# Patient Record
Sex: Male | Born: 1975
Health system: Southern US, Community
[De-identification: ages and names within clinical notes are randomized; demographics above are authoritative.]

## PROBLEM LIST (undated history)

## (undated) DIAGNOSIS — E785 Hyperlipidemia, unspecified: Secondary | ICD-10-CM

## (undated) DIAGNOSIS — A6920 Lyme disease, unspecified: Secondary | ICD-10-CM

## (undated) HISTORY — DX: Hyperlipidemia, unspecified: E78.5

## (undated) HISTORY — DX: Lyme disease, unspecified: A69.20

## (undated) HISTORY — PX: VASECTOMY: SHX75

---

## 2002-09-23 DIAGNOSIS — A6920 Lyme disease, unspecified: Secondary | ICD-10-CM

## 2002-09-23 HISTORY — DX: Lyme disease, unspecified: A69.20

## 2013-07-30 ENCOUNTER — Ambulatory Visit (INDEPENDENT_AMBULATORY_CARE_PROVIDER_SITE_OTHER): Payer: BC Managed Care – PPO | Admitting: Family Medicine

## 2013-07-30 ENCOUNTER — Encounter: Payer: Self-pay | Admitting: Family Medicine

## 2013-07-30 VITALS — BP 133/82 | HR 71 | Resp 16 | Ht 68.0 in | Wt 192.0 lb

## 2013-07-30 DIAGNOSIS — Z23 Encounter for immunization: Secondary | ICD-10-CM

## 2013-07-30 DIAGNOSIS — M722 Plantar fascial fibromatosis: Secondary | ICD-10-CM

## 2013-07-30 MED ORDER — TRAMADOL HCL 50 MG PO TABS: 50.0000 mg | ORAL_TABLET | Freq: Two times a day (BID) | ORAL | Status: DC | PRN

## 2013-07-30 MED ORDER — NABUMETONE 750 MG PO TABS: 750.0000 mg | ORAL_TABLET | Freq: Two times a day (BID) | ORAL | Status: DC | PRN

## 2013-07-30 NOTE — Patient Instructions (Signed)
1)  Foot Pain - It sounds like you probably have Plantar Fasciitis +/- Bone Spur.  Try taking the Relafen 750, Ultram 50 & Tylenol 1000 mg twice a day for pain to calm down the pain.     Plantar Fasciitis (Heel Spur Syndrome) with Rehab The plantar fascia is a fibrous, ligament-like, soft-tissue structure that spans the bottom of the foot. Plantar fasciitis is a condition that causes pain in the foot due to inflammation of the tissue. SYMPTOMS   Pain and tenderness on the underneath side of the foot.  Pain that worsens with standing or walking. CAUSES  Plantar fasciitis is caused by irritation and injury to the plantar fascia on the underneath side of the foot. Common mechanisms of injury include:  Direct trauma to bottom of the foot.  Damage to a small nerve that runs under the foot where the main fascia attaches to the heel bone.  Stress placed on the plantar fascia due to bone spurs. RISK INCREASES WITH:   Activities that place stress on the plantar fascia (running, jumping, pivoting, or cutting).  Poor strength and flexibility.  Improperly fitted shoes.  Tight calf muscles.  Flat feet.  Failure to warm-up properly before activity.  Obesity. PREVENTION  Warm up and stretch properly before activity.  Allow for adequate recovery between workouts.  Maintain physical fitness:  Strength, flexibility, and endurance.  Cardiovascular fitness.  Maintain a health body weight.  Avoid stress on the plantar fascia.  Wear properly fitted shoes, including arch supports for individuals who have flat feet. PROGNOSIS  If treated properly, then the symptoms of plantar fasciitis usually resolve without surgery. However, occasionally surgery is necessary. RELATED COMPLICATIONS   Recurrent symptoms that may result in a chronic condition.  Problems of the lower back that are caused by compensating for the injury, such as limping.  Pain or weakness of the foot during push-off  following surgery.  Chronic inflammation, scarring, and partial or complete fascia tear, occurring more often from repeated injections. TREATMENT  Treatment initially involves the use of ice and medication to help reduce pain and inflammation. The use of strengthening and stretching exercises may help reduce pain with activity, especially stretches of the Achilles tendon. These exercises may be performed at home or with a therapist. Your caregiver may recommend that you use heel cups of arch supports to help reduce stress on the plantar fascia. Occasionally, corticosteroid injections are given to reduce inflammation. If symptoms persist for greater than 6 months despite non-surgical (conservative), then surgery may be recommended.  MEDICATION   If pain medication is necessary, then nonsteroidal anti-inflammatory medications, such as aspirin and ibuprofen, or other minor pain relievers, such as acetaminophen, are often recommended.  Do not take pain medication within 7 days before surgery.  Prescription pain relievers may be given if deemed necessary by your caregiver. Use only as directed and only as much as you need.  Corticosteroid injections may be given by your caregiver. These injections should be reserved for the most serious cases, because they may only be given a certain number of times. HEAT AND COLD  Cold treatment (icing) relieves pain and reduces inflammation. Cold treatment should be applied for 10 to 15 minutes every 2 to 3 hours for inflammation and pain and immediately after any activity that aggravates your symptoms. Use ice packs or massage the area with a piece of ice (ice massage).  Heat treatment may be used prior to performing the stretching and strengthening activities prescribed by your  caregiver, physical therapist, or athletic trainer. Use a heat pack or soak the injury in warm water. SEEK IMMEDIATE MEDICAL CARE IF:  Treatment seems to offer no benefit, or the condition  worsens.  Any medications produce adverse side effects. EXERCISES RANGE OF MOTION (ROM) AND STRETCHING EXERCISES - Plantar Fasciitis (Heel Spur Syndrome) These exercises may help you when beginning to rehabilitate your injury. Your symptoms may resolve with or without further involvement from your physician, physical therapist or athletic trainer. While completing these exercises, remember:   Restoring tissue flexibility helps normal motion to return to the joints. This allows healthier, less painful movement and activity.  An effective stretch should be held for at least 30 seconds.  A stretch should never be painful. You should only feel a gentle lengthening or release in the stretched tissue. RANGE OF MOTION - Toe Extension, Flexion  Sit with your right / left leg crossed over your opposite knee.  Grasp your toes and gently pull them back toward the top of your foot. You should feel a stretch on the bottom of your toes and/or foot.  Hold this stretch for __________ seconds.  Now, gently pull your toes toward the bottom of your foot. You should feel a stretch on the top of your toes and or foot.  Hold this stretch for __________ seconds. Repeat __________ times. Complete this stretch __________ times per day.  RANGE OF MOTION - Ankle Dorsiflexion, Active Assisted  Remove shoes and sit on a chair that is preferably not on a carpeted surface.  Place right / left foot under knee. Extend your opposite leg for support.  Keeping your heel down, slide your right / left foot back toward the chair until you feel a stretch at your ankle or calf. If you do not feel a stretch, slide your bottom forward to the edge of the chair, while still keeping your heel down.  Hold this stretch for __________ seconds. Repeat __________ times. Complete this stretch __________ times per day.  STRETCH  Gastroc, Standing  Place hands on wall.  Extend right / left leg, keeping the front knee somewhat  bent.  Slightly point your toes inward on your back foot.  Keeping your right / left heel on the floor and your knee straight, shift your weight toward the wall, not allowing your back to arch.  You should feel a gentle stretch in the right / left calf. Hold this position for __________ seconds. Repeat __________ times. Complete this stretch __________ times per day. STRETCH  Soleus, Standing  Place hands on wall.  Extend right / left leg, keeping the other knee somewhat bent.  Slightly point your toes inward on your back foot.  Keep your right / left heel on the floor, bend your back knee, and slightly shift your weight over the back leg so that you feel a gentle stretch deep in your back calf.  Hold this position for __________ seconds. Repeat __________ times. Complete this stretch __________ times per day. STRETCH  Gastrocsoleus, Standing  Note: This exercise can place a lot of stress on your foot and ankle. Please complete this exercise only if specifically instructed by your caregiver.   Place the ball of your right / left foot on a step, keeping your other foot firmly on the same step.  Hold on to the wall or a rail for balance.  Slowly lift your other foot, allowing your body weight to press your heel down over the edge of the step.  You should feel a stretch in your right / left calf.  Hold this position for __________ seconds.  Repeat this exercise with a slight bend in your right / left knee. Repeat __________ times. Complete this stretch __________ times per day.  STRENGTHENING EXERCISES - Plantar Fasciitis (Heel Spur Syndrome)  These exercises may help you when beginning to rehabilitate your injury. They may resolve your symptoms with or without further involvement from your physician, physical therapist or athletic trainer. While completing these exercises, remember:   Muscles can gain both the endurance and the strength needed for everyday activities through  controlled exercises.  Complete these exercises as instructed by your physician, physical therapist or athletic trainer. Progress the resistance and repetitions only as guided. STRENGTH - Towel Curls  Sit in a chair positioned on a non-carpeted surface.  Place your foot on a towel, keeping your heel on the floor.  Pull the towel toward your heel by only curling your toes. Keep your heel on the floor.  If instructed by your physician, physical therapist or athletic trainer, add ____________________ at the end of the towel. Repeat __________ times. Complete this exercise __________ times per day. STRENGTH - Ankle Inversion  Secure one end of a rubber exercise band/tubing to a fixed object (table, pole). Loop the other end around your foot just before your toes.  Place your fists between your knees. This will focus your strengthening at your ankle.  Slowly, pull your big toe up and in, making sure the band/tubing is positioned to resist the entire motion.  Hold this position for __________ seconds.  Have your muscles resist the band/tubing as it slowly pulls your foot back to the starting position. Repeat __________ times. Complete this exercises __________ times per day.  Document Released: 09/09/2005 Document Revised: 12/02/2011 Document Reviewed: 12/22/2008 [facility] Patient Information 2014 [place], [place].  Plantar Fasciitis Plantar fasciitis is a common condition that causes foot pain. It is soreness (inflammation) of the band of tough fibrous tissue on the bottom of the foot that runs from the heel bone (calcaneus) to the ball of the foot. The cause of this soreness may be from excessive standing, poor fitting shoes, running on hard surfaces, being overweight, having an abnormal walk, or overuse (this is common in runners) of the painful foot or feet. It is also common in aerobic exercise dancers and ballet dancers. SYMPTOMS  Most people with plantar fasciitis complain of:  Severe  pain in the morning on the bottom of their foot especially when taking the first steps out of bed. This pain recedes after a few minutes of walking.  Severe pain is experienced also during walking following a long period of inactivity.  Pain is worse when walking barefoot or up stairs DIAGNOSIS   Your caregiver will diagnose this condition by examining and feeling your foot.  Special tests such as X-rays of your foot, are usually not needed. PREVENTION   Consult a sports medicine professional before beginning a new exercise program.  Walking programs offer a good workout. With walking there is a lower chance of overuse injuries common to runners. There is less impact and less jarring of the joints.  Begin all new exercise programs slowly. If problems or pain develop, decrease the amount of time or distance until you are at a comfortable level.  Wear good shoes and replace them regularly.  Stretch your foot and the heel cords at the back of the ankle (Achilles tendon) both before and after exercise.  Run  or exercise on even surfaces that are not hard. For example, asphalt is better than pavement.  Do not run barefoot on hard surfaces.  If using a treadmill, vary the incline.  Do not continue to workout if you have foot or joint problems. Seek professional help if they do not improve. HOME CARE INSTRUCTIONS   Avoid activities that cause you pain until you recover.  Use ice or cold packs on the problem or painful areas after working out.  Only take over-the-counter or prescription medicines for pain, discomfort, or fever as directed by your caregiver.  Soft shoe inserts or athletic shoes with air or gel sole cushions may be helpful.  If problems continue or become more severe, consult a sports medicine caregiver or your own health care provider. Cortisone is a potent anti-inflammatory medication that may be injected into the painful area. You can discuss this treatment with your  caregiver. MAKE SURE YOU:   Understand these instructions.  Will watch your condition.  Will get help right away if you are not doing well or get worse. Document Released: 06/04/2001 Document Revised: 12/02/2011 Document Reviewed: 08/03/2008 [facility] Patient Information 2014 [place], [place].

## 2013-07-30 NOTE — Progress Notes (Signed)
  Subjective:    Patient ID: Dennis Leach, male    DOB: 1975/10/11, 37 y.o.   MRN: [id number]  HPI  Dennis Leach is here today to establish care with our practice.  Dennis Leach found our name online.  Dennis Leach says that Dennis Leach has not had a PCP for several years.  Dennis Leach and his family recently moved to [place] from [place].  Overall, Dennis Leach feels that Dennis Leach is in good health.  His biggest complaint today is pain in his feet.  Dennis Leach has been having this pain for the past year.  Dennis Leach describes the pain as a burning sensation that is worse in his heal.  Dennis Leach has bought special shoes for work and Dennis Leach has also taken OTC analgesics which have not helped him much.      Review of Systems  Constitutional: Negative.   HENT: Negative.   Eyes: Negative.   Respiratory: Negative.   Cardiovascular: Negative.   Gastrointestinal: Negative.   Endocrine: Negative.   Genitourinary: Negative.   Musculoskeletal:       Bilateral feet pain  Skin: Negative.   Allergic/Immunologic: Negative.   Neurological: Negative.   Hematological: Negative.   Psychiatric/Behavioral: Negative.      Past Medical History  Diagnosis Date  . Lyme disease 2004  . Hyperlipidemia      History   Social History Narrative   Marital Status: Married [employer] manager)    Children:  Daughter Duwayne Heck) Son Reuel Boom)    Pets: None    Living Situation: Lives with wife and children    Occupation:  Emergency planning/management officer - Corporate treasurer)    Education: Engineer, agricultural    Tobacco Use/Exposure:  None    Alcohol Use:  None    Drug Use:  None   Diet:  Regular   Exercise:  Limited    Hobbies:  Play Soccer                 Family History  Problem Relation Age of Onset  . Thrombosis Father      No Known Allergies   Immunization History  Administered Date(s) Administered  . Influenza,inj,Quad PF,36+ Mos 07/30/2013       Objective:   Physical Exam  Vitals reviewed. Constitutional: Dennis Leach is oriented to person, place, and time. Dennis Leach appears well-developed and  well-nourished.  Cardiovascular: Normal rate and regular rhythm.   Pulmonary/Chest: Effort normal and breath sounds normal.  Musculoskeletal: Dennis Leach exhibits tenderness (Pain in heels ). Dennis Leach exhibits no edema.  Neurological: Dennis Leach is alert and oriented to person, place, and time.  Skin: Skin is warm and dry.  Psychiatric: Dennis Leach has a normal mood and affect.      Assessment & Plan:    Dennis Leach was seen today for pain in feet.  Diagnoses and associated orders for this visit:  Plantar fasciitis, bilateral - nabumetone (RELAFEN) 750 MG tablet; Take 1 tablet (750 mg total) by mouth 2 (two) times daily as needed for moderate pain. - traMADol (ULTRAM) 50 MG tablet; Take 1 tablet (50 mg total) by mouth every 12 (twelve) hours as needed for moderate pain.  Need for prophylactic vaccination and inoculation against influenza - Flu Vaccine QUAD 36+ mos PF IM (Fluarix)   TIME SPENT "FACE TO FACE" WITH PATIENT -  30  MINS

## 2013-09-28 DIAGNOSIS — M722 Plantar fascial fibromatosis: Secondary | ICD-10-CM | POA: Insufficient documentation

## 2013-09-28 DIAGNOSIS — Z23 Encounter for immunization: Secondary | ICD-10-CM | POA: Insufficient documentation

## 2013-09-28 NOTE — Assessment & Plan Note (Signed)
The patient confirmed that they are not allergic to eggs and have never had a bad reaction with the flu shot in the past.  The vaccination was given without difficulty.   

## 2013-10-23 DIAGNOSIS — M722 Plantar fascial fibromatosis: Secondary | ICD-10-CM | POA: Insufficient documentation

## 2014-01-31 ENCOUNTER — Other Ambulatory Visit: Payer: Self-pay | Admitting: *Deleted

## 2014-01-31 DIAGNOSIS — Z0000 Encounter for general adult medical examination without abnormal findings: Secondary | ICD-10-CM

## 2014-02-01 ENCOUNTER — Other Ambulatory Visit: Payer: BC Managed Care – PPO

## 2014-02-01 LAB — CBC WITH DIFFERENTIAL/PLATELET
Basophils Absolute: 0.1 10*3/uL (ref 0.0–0.1)
Basophils Relative: 1 % (ref 0–1)
Eosinophils Absolute: 0.3 10*3/uL (ref 0.0–0.7)
Eosinophils Relative: 5 % (ref 0–5)
HCT: 42.1 % (ref 39.0–52.0)
Hemoglobin: 14.7 g/dL (ref 13.0–17.0)
Lymphocytes Relative: 54 % — ABNORMAL HIGH (ref 12–46)
Lymphs Abs: 3.3 10*3/uL (ref 0.7–4.0)
MCH: 30.6 pg (ref 26.0–34.0)
MCHC: 34.9 g/dL (ref 30.0–36.0)
MCV: 87.5 fL (ref 78.0–100.0)
Monocytes Absolute: 0.5 10*3/uL (ref 0.1–1.0)
Monocytes Relative: 8 % (ref 3–12)
Neutro Abs: 2 10*3/uL (ref 1.7–7.7)
Neutrophils Relative %: 32 % — ABNORMAL LOW (ref 43–77)
Platelets: 369 10*3/uL (ref 150–400)
RBC: 4.81 MIL/uL (ref 4.22–5.81)
RDW: 13.7 % (ref 11.5–15.5)
WBC: 6.1 10*3/uL (ref 4.0–10.5)

## 2014-02-01 LAB — COMPLETE METABOLIC PANEL WITH GFR
ALT: 16 U/L (ref 0–53)
AST: 18 U/L (ref 0–37)
Albumin: 4.5 g/dL (ref 3.5–5.2)
Alkaline Phosphatase: 58 U/L (ref 39–117)
BUN: 20 mg/dL (ref 6–23)
CO2: 22 mEq/L (ref 19–32)
Calcium: 9.2 mg/dL (ref 8.4–10.5)
Chloride: 105 mEq/L (ref 96–112)
Creat: 1.08 mg/dL (ref 0.50–1.35)
GFR, Est African American: >89 mL/min
GFR, Est Non African American: 85 mL/min
Glucose, Bld: 102 mg/dL — ABNORMAL HIGH (ref 70–99)
Potassium: 4.3 mEq/L (ref 3.5–5.3)
Sodium: 140 mEq/L (ref 135–145)
Total Bilirubin: 0.9 mg/dL (ref 0.2–1.2)
Total Protein: 6.7 g/dL (ref 6.0–8.3)

## 2014-02-01 LAB — LIPID PANEL
Cholesterol: 166 mg/dL (ref 0–200)
HDL: 47 mg/dL (ref >39)
LDL Cholesterol: 97 mg/dL (ref 0–99)
Total CHOL/HDL Ratio: 3.5 Ratio
Triglycerides: 109 mg/dL (ref <150)
VLDL: 22 mg/dL (ref 0–40)

## 2014-02-01 LAB — TSH: TSH: 2.019 u[IU]/mL (ref 0.350–4.500)

## 2014-02-02 LAB — VITAMIN D 25 HYDROXY (VIT D DEFICIENCY, FRACTURES): Vit D, 25-Hydroxy: 22 ng/mL — ABNORMAL LOW (ref 30–89)

## 2014-02-08 ENCOUNTER — Encounter: Payer: Self-pay | Admitting: Family Medicine

## 2014-02-08 ENCOUNTER — Ambulatory Visit (INDEPENDENT_AMBULATORY_CARE_PROVIDER_SITE_OTHER): Payer: BC Managed Care – PPO | Admitting: Family Medicine

## 2014-02-08 VITALS — BP 114/76 | HR 78 | Resp 16 | Ht 68.0 in | Wt 193.0 lb

## 2014-02-08 DIAGNOSIS — K219 Gastro-esophageal reflux disease without esophagitis: Secondary | ICD-10-CM

## 2014-02-08 DIAGNOSIS — Z0000 Encounter for general adult medical examination without abnormal findings: Secondary | ICD-10-CM

## 2014-02-08 DIAGNOSIS — R05 Cough: Secondary | ICD-10-CM

## 2014-02-08 DIAGNOSIS — Z23 Encounter for immunization: Secondary | ICD-10-CM

## 2014-02-08 DIAGNOSIS — R059 Cough, unspecified: Secondary | ICD-10-CM

## 2014-02-08 LAB — POCT URINALYSIS DIPSTICK
Bilirubin, UA: NEGATIVE
Blood, UA: NEGATIVE
Glucose, UA: NEGATIVE
Ketones, UA: NEGATIVE
Leukocytes, UA: NEGATIVE
Nitrite, UA: NEGATIVE
Protein, UA: NEGATIVE
Spec Grav, UA: 1.02
Urobilinogen, UA: NEGATIVE
pH, UA: 6.5

## 2014-02-08 MED ORDER — PANTOPRAZOLE SODIUM 40 MG PO TBEC: 40.0000 mg | DELAYED_RELEASE_TABLET | Freq: Every day | ORAL | Status: DC

## 2014-02-08 NOTE — Patient Instructions (Addendum)
1)  Cough - Consider post nasal drip vs GERD - You can try some Zyrtec for the post nasal drip and Protonix for the acid reflux.    2)  Preventative - Take the Vitamiin D3 2,000 IU and complete stool cards (Guaiac Test) and return to our office.  You received a TDAP at today's visit.     Cough, Adult  A cough is a reflex that helps clear your throat and airways. It can help heal the body or may be a reaction to an irritated airway. A cough may only last 2 or 3 weeks (acute) or may last more than 8 weeks (chronic).  CAUSES Acute cough:  Viral or bacterial infections. Chronic cough:  Infections.  Allergies.  Asthma.  Post-nasal drip.  Smoking.  Heartburn or acid reflux.  Some medicines.  Chronic lung problems (COPD).  Cancer. SYMPTOMS   Cough.  Fever.  Chest pain.  Increased breathing rate.  High-pitched whistling sound when breathing (wheezing).  Colored mucus that you cough up (sputum). TREATMENT   A bacterial cough may be treated with antibiotic medicine.  A viral cough must run its course and will not respond to antibiotics.  Your caregiver may recommend other treatments if you have a chronic cough. HOME CARE INSTRUCTIONS   Only take over-the-counter or prescription medicines for pain, discomfort, or fever as directed by your caregiver. Use cough suppressants only as directed by your caregiver.  Use a cold steam vaporizer or humidifier in your bedroom or home to help loosen secretions.  Sleep in a semi-upright position if your cough is worse at night.  Rest as needed.  Stop smoking if you smoke. SEEK IMMEDIATE MEDICAL CARE IF:   You have pus in your sputum.  Your cough starts to worsen.  You cannot control your cough with suppressants and are losing sleep.  You begin coughing up blood.  You have difficulty breathing.  You develop pain which is getting worse or is uncontrolled with medicine.  You have a fever. MAKE SURE YOU:   Understand  these instructions.  Will watch your condition.  Will get help right away if you are not doing well or get worse. Document Released: 03/08/2011 Document Revised: 12/02/2011 Document Reviewed: 03/08/2011 [facility] Patient Information 2014 [place], [place].  Guaiac Test A guaiac test checks for blood in the poop (bowel movement). BEFORE THE TEST  Avoid alcohol and iron pills.  Only take medicine as told by your doctor.  Ask your doctor about stopping or changing medicines.  Avoid Vitamin C pills or medicines.  Avoid antiseptic solution that has iodine in it.  Avoid citrus fruits and juices. TEST  Put the sample cards and sticks in the bathroom so they will be ready when you have to poop.  Collect a poop sample with the stick.  Smear a small amount of poop on the card.  Take another sample from a different part of the poop.  Store the cards with the samples in a plastic bag.  Throw the rest of the poop into the toilet and flush.  Wash your hands well. AFTER THE TEST  Bring the cards in a plastic bag to your clinic.  Do not wait more than 3 days to take the card to your clinic. Document Released: 06/18/2008 Document Revised: 12/02/2011 Document Reviewed: 06/18/2008 [facility] Patient Information 2014 [place], [place].

## 2014-02-08 NOTE — Progress Notes (Signed)
   Subjective:    Patient ID: Dennis Leach, male    DOB: 06/10/1976, 38 y.o.   MRN: [id number]  HPI  Dennis Leach is here today for his annual CPE. We are going over his lab results.  Overall, he feels that he is in good health.  His only complaint is a cough that he has had for the past 2 months.  He says that it is worse at night. He has taken OTC cough meds which have not helped.    Review of Systems  Constitutional: Negative for activity change, appetite change and fatigue.  Respiratory: Positive for cough.   Cardiovascular: Negative for chest pain, palpitations and leg swelling.  Gastrointestinal: Negative for constipation.  Psychiatric/Behavioral: Negative for behavioral problems and sleep disturbance. The patient is not nervous/anxious.   All other systems reviewed and are negative.    Past Medical History  Diagnosis Date  . Lyme disease 2004  . 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 Thrombosis Father   . Diabetes Father      No Known Allergies   Immunization History  Administered Date(s) Administered  . Influenza,inj,Quad PF,36+ Mos 07/30/2013  . Tdap 02/08/2014       Objective:   Physical Exam  Nursing note and vitals reviewed. Constitutional: He is oriented to person, place, and time. He appears well-developed and well-nourished.  HENT:  Head: Normocephalic and atraumatic.  Right Ear: External ear normal.  Left Ear: External ear normal.  Nose: Nose normal.  Mouth/Throat: Oropharynx is clear and  moist.  Eyes: Conjunctivae and EOM are normal. Pupils are equal, round, and reactive to light.  Neck: Normal range of motion. Neck supple.  Cardiovascular: Normal rate, regular rhythm, normal heart sounds and intact distal pulses.   Pulmonary/Chest: Effort normal and breath sounds normal.  Abdominal: Soft. Bowel sounds are normal.  Genitourinary: Penis normal.  Musculoskeletal: Normal range of motion.       Legs: Neurological: He is alert and oriented to person, place, and time. He has normal reflexes.  Skin: Skin is warm and dry.  Psychiatric: He has a normal mood and affect. His behavior is normal. Judgment and thought content normal.      Assessment & Plan:    Dennis Leach was seen today for annual exam.  Diagnoses and associated orders for this visit:  Routine general medical examination at a health care facility - POCT urinalysis dipstick  Cough  GERD (gastroesophageal reflux disease) - pantoprazole (PROTONIX) 40 MG tablet; Take 1 tablet (40 mg total) by mouth daily.  Need for Tdap vaccination - Tdap vaccine greater than or equal to 7yo IM

## 2014-05-05 DIAGNOSIS — R059 Cough, unspecified: Secondary | ICD-10-CM | POA: Insufficient documentation

## 2014-05-05 DIAGNOSIS — K219 Gastro-esophageal reflux disease without esophagitis: Secondary | ICD-10-CM | POA: Insufficient documentation

## 2014-05-05 DIAGNOSIS — R05 Cough: Secondary | ICD-10-CM | POA: Insufficient documentation

## 2020-10-23 DIAGNOSIS — R0683 Snoring: Secondary | ICD-10-CM | POA: Diagnosis not present

## 2020-10-23 DIAGNOSIS — G4719 Other hypersomnia: Secondary | ICD-10-CM | POA: Diagnosis not present

## 2020-10-31 DIAGNOSIS — R0683 Snoring: Secondary | ICD-10-CM | POA: Diagnosis not present

## 2020-10-31 DIAGNOSIS — G4719 Other hypersomnia: Secondary | ICD-10-CM | POA: Diagnosis not present

## 2020-11-08 DIAGNOSIS — G4733 Obstructive sleep apnea (adult) (pediatric): Secondary | ICD-10-CM | POA: Diagnosis not present

## 2020-12-25 DIAGNOSIS — G4733 Obstructive sleep apnea (adult) (pediatric): Secondary | ICD-10-CM | POA: Diagnosis not present

## 2021-01-03 DIAGNOSIS — G4733 Obstructive sleep apnea (adult) (pediatric): Secondary | ICD-10-CM | POA: Diagnosis not present

## 2021-05-02 ENCOUNTER — Other Ambulatory Visit (HOSPITAL_COMMUNITY): Payer: Self-pay | Admitting: Family Medicine

## 2021-05-02 ENCOUNTER — Emergency Department (HOSPITAL_COMMUNITY): Payer: 59

## 2021-05-02 ENCOUNTER — Other Ambulatory Visit: Payer: Self-pay

## 2021-05-02 ENCOUNTER — Ambulatory Visit (HOSPITAL_BASED_OUTPATIENT_CLINIC_OR_DEPARTMENT_OTHER)
Admission: RE | Admit: 2021-05-02 | Discharge: 2021-05-02 | Disposition: A | Discharge status: Home / Self Care | Payer: 59 | Source: Ambulatory Visit | Attending: Family Medicine | Admitting: Family Medicine

## 2021-05-02 ENCOUNTER — Encounter (HOSPITAL_COMMUNITY): Payer: Self-pay | Admitting: *Deleted

## 2021-05-02 ENCOUNTER — Emergency Department (HOSPITAL_COMMUNITY)
Admission: EM | Admit: 2021-05-02 | Discharge: 2021-05-02 | Disposition: A | Discharge status: Home / Self Care | Payer: 59 | Attending: Emergency Medicine | Admitting: Emergency Medicine

## 2021-05-02 DIAGNOSIS — M79605 Pain in left leg: Secondary | ICD-10-CM

## 2021-05-02 DIAGNOSIS — I82492 Acute embolism and thrombosis of other specified deep vein of left lower extremity: Secondary | ICD-10-CM

## 2021-05-02 DIAGNOSIS — M7989 Other specified soft tissue disorders: Secondary | ICD-10-CM

## 2021-05-02 DIAGNOSIS — I2699 Other pulmonary embolism without acute cor pulmonale: Secondary | ICD-10-CM | POA: Diagnosis not present

## 2021-05-02 DIAGNOSIS — I2694 Multiple subsegmental pulmonary emboli without acute cor pulmonale: Secondary | ICD-10-CM

## 2021-05-02 DIAGNOSIS — Z1211 Encounter for screening for malignant neoplasm of colon: Secondary | ICD-10-CM | POA: Diagnosis not present

## 2021-05-02 DIAGNOSIS — R7303 Prediabetes: Secondary | ICD-10-CM | POA: Diagnosis not present

## 2021-05-02 DIAGNOSIS — R9431 Abnormal electrocardiogram [ECG] [EKG]: Secondary | ICD-10-CM | POA: Diagnosis not present

## 2021-05-02 DIAGNOSIS — Z0000 Encounter for general adult medical examination without abnormal findings: Secondary | ICD-10-CM | POA: Diagnosis not present

## 2021-05-02 DIAGNOSIS — I82402 Acute embolism and thrombosis of unspecified deep veins of left lower extremity: Secondary | ICD-10-CM | POA: Diagnosis not present

## 2021-05-02 LAB — CBC WITH DIFFERENTIAL/PLATELET
Abs Immature Granulocytes: 0.08 10*3/uL — ABNORMAL HIGH (ref 0.00–0.07)
Basophils Absolute: 0.1 10*3/uL (ref 0.0–0.1)
Basophils Relative: 1 %
Eosinophils Absolute: 0.7 10*3/uL — ABNORMAL HIGH (ref 0.0–0.5)
Eosinophils Relative: 7 %
HCT: 47.1 % (ref 39.0–52.0)
Hemoglobin: 15.6 g/dL (ref 13.0–17.0)
Immature Granulocytes: 1 %
Lymphocytes Relative: 30 %
Lymphs Abs: 2.7 10*3/uL (ref 0.7–4.0)
MCH: 30.1 pg (ref 26.0–34.0)
MCHC: 33.1 g/dL (ref 30.0–36.0)
MCV: 90.9 fL (ref 80.0–100.0)
Monocytes Absolute: 0.5 10*3/uL (ref 0.1–1.0)
Monocytes Relative: 6 %
Neutro Abs: 5.1 10*3/uL (ref 1.7–7.7)
Neutrophils Relative %: 55 %
Platelets: 331 10*3/uL (ref 150–400)
RBC: 5.18 MIL/uL (ref 4.22–5.81)
RDW: 13.4 % (ref 11.5–15.5)
WBC: 9.1 10*3/uL (ref 4.0–10.5)
nRBC: 0 % (ref 0.0–0.2)

## 2021-05-02 LAB — BASIC METABOLIC PANEL
Anion gap: 7 (ref 5–15)
BUN: 20 mg/dL (ref 6–20)
CO2: 24 mmol/L (ref 22–32)
Calcium: 9.7 mg/dL (ref 8.9–10.3)
Chloride: 106 mmol/L (ref 98–111)
Creatinine, Ser: 1.06 mg/dL (ref 0.61–1.24)
GFR, Estimated: >60 mL/min (ref >60)
Glucose, Bld: 116 mg/dL — ABNORMAL HIGH (ref 70–99)
Potassium: 3.7 mmol/L (ref 3.5–5.1)
Sodium: 137 mmol/L (ref 135–145)

## 2021-05-02 LAB — TROPONIN I (HIGH SENSITIVITY)
Troponin I (High Sensitivity): 3 ng/L (ref <18)
Troponin I (High Sensitivity): 3 ng/L (ref <18)

## 2021-05-02 MED ORDER — APIXABAN (ELIQUIS) EDUCATION KIT FOR DVT/PE PATIENTS
PACK | Freq: Once | Status: AC
Start: 2021-05-02 — End: 2021-05-02
  Filled 2021-05-02: qty 1

## 2021-05-02 MED ORDER — RIVAROXABAN (XARELTO) EDUCATION KIT FOR DVT/PE PATIENTS: PACK | Freq: Once | Status: DC

## 2021-05-02 MED ORDER — RIVAROXABAN 15 MG PO TABS: 15.0000 mg | ORAL_TABLET | Freq: Once | ORAL | Status: DC

## 2021-05-02 MED ORDER — IOHEXOL 350 MG/ML SOLN
100.0000 mL | Freq: Once | INTRAVENOUS | Status: AC | PRN
  Administered 2021-05-02: 65 mL via INTRAVENOUS

## 2021-05-02 MED ORDER — APIXABAN 5 MG PO TABS
10.0000 mg | ORAL_TABLET | Freq: Once | ORAL | Status: AC
Start: 2021-05-02 — End: 2021-05-02
  Administered 2021-05-02: 10 mg via ORAL
  Filled 2021-05-02: qty 2

## 2021-05-02 NOTE — ED Provider Notes (Signed)
Pt seen in conjunction with C Prosperi, PA-C. Please see his notes for full history, exam, and plan.   In brief, patient presenting for evaluation of left leg pain and intermittent chest pain shortness of breath.  Ultrasound ordered by PCP positive for DVT.  As patient reported intermittent chest pain shortness of breath, CTA PE was ordered in triage, found to have small subsegmental PEs on the right side.  However patient able to ambulate without hypoxia, tachycardia, shortness of breath.  States he is feeling well at this time, no symptoms.  Pasi score puts him at very low risk.  Discussed plan for outpatient treatment with a DOAC.  Patient's PCP has already called and Eliquis to patient's pharmacy, as such, first dose given in the ER and starter pack given by pharmacy.  Encouraged close follow-up with PCP.   Alveria Apley, PA-C 05/02/21 2218    Benjiman Core, MD 05/02/21 2306

## 2021-05-02 NOTE — Progress Notes (Signed)
Lower extremity venous LT study completed.  Attempted to call results to Chanetta Marshall, MD. Office closed. Patient taken to ED.  See CV Proc for preliminary results report.   Jean Rosenthal, RDMS, RVT

## 2021-05-02 NOTE — ED Triage Notes (Addendum)
The pt has had lt leg pain for 10 days  he also has some redness around the calf area  No known injury

## 2021-05-02 NOTE — ED Provider Notes (Signed)
Emergency Medicine Provider Triage Evaluation Note  Dennis Leach , a 45 y.o. male  was evaluated in triage.  Pt complains of left leg pain and swelling. Had [place] pta that showed dvt. Sent here for eval. He is c/o some chest pain/sob. He had a recent 10 hour flight to [place]  Review of Systems  Positive: Leg swelling ,chest pain, sob Negative: fever  Physical Exam  BP (!) 119/91 (BP Location: Left Arm)   Pulse (!) 107   Temp 99.2 F (37.3 C)   Resp 17   Ht 5\' 8"  (1.727 m)   Wt 87.5 kg   SpO2 97%   BMI 29.33 kg/m  Gen:   Awake, no distress   Resp:  Normal effort  MSK:   Moves extremities without difficulty  Other:  Lle ttp, swelling and erythema, tachycardia  Medical Decision Making  Medically screening exam initiated at 5:52 PM.  Appropriate orders placed.  Dennis Leach was informed that the remainder of the evaluation will be completed by another provider, this initial triage assessment does not replace that evaluation, and the importance of remaining in the ED until their evaluation is complete.     Antionette Poles, PA-C 05/02/21 1754    07/02/21, DO 05/03/21 0019

## 2021-05-02 NOTE — ED Provider Notes (Signed)
[facility] EMERGENCY DEPARTMENT Provider Note   CSN: [id number] Arrival date & time: 05/02/21  1714     History Chief Complaint  Patient presents with   Leg Pain    Dennis Leach is a 45 y.o. male with no significant past medical history who presented to the emergency department with left leg pain and swelling. Patient was evaluated by Dr. Lindell Noe at family medicine earlier today who performed a LE Venous Duplex Ultrasound which showed a DVT. Patient was sent to the emergency department for further evaluation at this time. Patient was recently travelling in [place] and returned home on July 29 on a 14 hour flight including an additional 10 hours of nonambulation while sitting in the airport. Two days later the patient began experiencing some leg pain which increased over time becoming hot, tender, and interfering with the patient's sleep. In the last three days, patient reports some chest tightness, and shortness of breath which is what prompted him to see his doctor today. Patient denies personal or family history of cancer, CHF, chronic lung disease. Patient reports no recent or current cold or flu symptoms. Since arrival in the department patient denies difficulty breathing, chest tightness, hemoptysis. Ongoing pain in left calf.    Leg Pain Associated symptoms: no fever       Past Medical History:  Diagnosis Date   Hyperlipidemia    Lyme disease 2004    Patient Active Problem List   Diagnosis Date Noted   Esophageal reflux 05/05/2014   Cough 05/05/2014   Plantar fasciitis, bilateral 10/23/2013   Plantar fasciitis 09/28/2013   Need for prophylactic vaccination and inoculation against influenza 09/28/2013    History reviewed. No pertinent surgical history.     Family History  Problem Relation Age of Onset   Thrombosis Father    Diabetes Father     Social History   Tobacco Use   Smoking status: Never   Smokeless tobacco: Never  Substance Use  Topics   Alcohol use: No   Drug use: No    Home Medications Prior to Admission medications   Medication Sig Start Date End Date Taking? Authorizing Provider  pantoprazole (PROTONIX) 40 MG tablet Take 1 tablet (40 mg total) by mouth daily. 02/08/14 02/08/15  Jonathon Resides, MD    Allergies    Patient has no known allergies.  Review of Systems   Review of Systems  Constitutional:  Negative for diaphoresis and fever.  HENT:  Negative for congestion, rhinorrhea, sneezing and sore throat.   Respiratory:  Negative for cough, chest tightness and shortness of breath.   Cardiovascular:  Positive for leg swelling. Negative for chest pain and palpitations.  Gastrointestinal:  Negative for abdominal pain, nausea and vomiting.  Skin:  Negative for pallor.  Neurological:  Negative for dizziness, syncope, speech difficulty, weakness, light-headedness and headaches.   Physical Exam Updated Vital Signs BP (!) 127/91 (BP Location: Right Arm)   Pulse 84   Temp 98 F (36.7 C) (Oral)   Resp (!) 22   Ht $R'5\' 8"'KG$  (1.727 m)   Wt 87.5 kg   SpO2 99%   BMI 29.33 kg/m   Physical Exam Constitutional:      General: He is not in acute distress.    Appearance: Normal appearance. He is not ill-appearing or diaphoretic.  HENT:     Head: Normocephalic and atraumatic.  Eyes:     General:        Right eye: No discharge.  Left eye: No discharge.  Cardiovascular:     Rate and Rhythm: Normal rate and regular rhythm.     Pulses: Normal pulses.     Heart sounds: Normal heart sounds. No murmur heard.   No friction rub. No gallop.  Pulmonary:     Effort: Pulmonary effort is normal. No respiratory distress.     Breath sounds: Normal breath sounds. No wheezing or rales.  Chest:     Chest wall: No tenderness.  Abdominal:     General: Bowel sounds are normal.     Palpations: Abdomen is soft.     Tenderness: There is no abdominal tenderness.  Musculoskeletal:        General: Swelling and tenderness  present.     Cervical back: Neck supple.     Comments: Red, warm, swollen, and focally tender area on the posterior left calf  Skin:    General: Skin is warm and dry.     Capillary Refill: Capillary refill takes less than 2 seconds.  Neurological:     General: No focal deficit present.     Mental Status: He is alert and oriented to person, place, and time.  Psychiatric:        Mood and Affect: Mood normal.        Behavior: Behavior normal.    ED Results / Procedures / Treatments   Labs (all labs ordered are listed, but only abnormal results are displayed) Labs Reviewed  CBC WITH DIFFERENTIAL/PLATELET - Abnormal; Notable for the following components:      Result Value   Eosinophils Absolute 0.7 (*)    Abs Immature Granulocytes 0.08 (*)    All other components within normal limits  BASIC METABOLIC PANEL - Abnormal; Notable for the following components:   Glucose, Bld 116 (*)    All other components within normal limits  TROPONIN I (HIGH SENSITIVITY)  TROPONIN I (HIGH SENSITIVITY)    EKG None   EKG Interpretation  Date/Time: 05/02/21   Ventricular Rate: 87 PR Interval: 172   QRS Duration:  84 QT Interval: 352 QTC Calculation: 423 R Axis: 45   Text Interpretation: Normal sinus rhythm, normal EKG         Radiology CT Angio Chest PE W and/or Wo Contrast  Addendum Date: 05/02/2021   ADDENDUM REPORT: 05/02/2021 21:26 ADDENDUM: Critical Value/emergent results were called by telephone at the time of interpretation on 05/02/2021 at 9:09 pm to provider Alecia Lemming, PA, who verbally acknowledged these results. Electronically Signed   By: Suzy Bouchard M.D.   On: 05/02/2021 21:26   Result Date: 05/02/2021 CLINICAL DATA:  High probability PE.  Short of breath EXAM: CT ANGIOGRAPHY CHEST WITH CONTRAST TECHNIQUE: Multidetector CT imaging of the chest was performed using the standard protocol during bolus administration of intravenous contrast. Multiplanar CT image reconstructions  and MIPs were obtained to evaluate the vascular anatomy. CONTRAST:  69mL OMNIPAQUE IOHEXOL 350 MG/ML SOLN COMPARISON:  None. FINDINGS: Cardiovascular: Tubular filling defects within the proximal segmental branches of the RIGHT lower lobe (image 182/7). Portions of the thrombus are wall adherent (image 188/7). Filling defect within the distal subsegmental branches of the LEFT lower lobe (image 269/7). Small filling defect within a RIGHT upper lobe pulmonary artery on image 141. overall the clot burden is mild and partially occlusive. No CT evidence of RIGHT ventricular strain. Mediastinum/Nodes: No axillary or supraclavicular adenopathy. No mediastinal hilar adenopathy no pericardial effusion. Lungs/Pleura: No pulmonary infarction.  Airways normal. Upper Abdomen: Limited view of the  liver, kidneys, pancreas are unremarkable. Normal adrenal glands. Musculoskeletal: No aggressive osseous lesion. Review of the MIP images confirms the above findings. IMPRESSION: 1. Acute to subacute bilateral lower lobe and RIGHT upper lobe segmental and sub segmental pulmonary thromboemboli. Overall clot burden is mild. No RIGHT ventricular strain. 2. No pulmonary infarction. Electronically Signed: By: Suzy Bouchard M.D. On: 05/02/2021 21:06   VAS [place] LOWER EXTREMITY VENOUS (DVT)  Result Date: 05/02/2021  Lower Venous DVT Study Patient Name:  Dennis Leach  Date of Exam:   05/02/2021 Medical Rec #: [id number]    Accession #:    [id number] Date of Birth: 1976/03/19     Patient Gender: M Patient Age:   82 years Exam Location:  [facility] Procedure:      VAS [place] LOWER EXTREMITY VENOUS (DVT) Referring Phys: Sela Hilding --------------------------------------------------------------------------------  Indications: Pain, erythema, swelling, palpable cord LT leg.  Risk Factors: Recent extended travel. Comparison Study: No prior studies. Performing Technologist: Darlin Coco RDMS,RVT  Examination Guidelines: A complete evaluation  includes B-mode imaging, spectral Doppler, color Doppler, and power Doppler as needed of all accessible portions of each vessel. Bilateral testing is considered an integral part of a complete examination. Limited examinations for reoccurring indications may be performed as noted. The reflux portion of the exam is performed with the patient in reverse Trendelenburg.  +-----+---------------+---------+-----------+----------+--------------+ RIGHTCompressibilityPhasicitySpontaneityPropertiesThrombus Aging +-----+---------------+---------+-----------+----------+--------------+ CFV  Full           Yes      Yes                                 +-----+---------------+---------+-----------+----------+--------------+   +---------+---------------+---------+-----------+----------+--------------+ LEFT     CompressibilityPhasicitySpontaneityPropertiesThrombus Aging +---------+---------------+---------+-----------+----------+--------------+ CFV      Full           Yes      Yes                                 +---------+---------------+---------+-----------+----------+--------------+ SFJ      Full                                                        +---------+---------------+---------+-----------+----------+--------------+ FV Prox  Full                                                        +---------+---------------+---------+-----------+----------+--------------+ FV Mid   Full                                                        +---------+---------------+---------+-----------+----------+--------------+ FV DistalFull                                                        +---------+---------------+---------+-----------+----------+--------------+ PFV  Full                                                        +---------+---------------+---------+-----------+----------+--------------+ POP      Full           Yes      Yes                                  +---------+---------------+---------+-----------+----------+--------------+ PTV      Full                                                        +---------+---------------+---------+-----------+----------+--------------+ PERO     Full                                                        +---------+---------------+---------+-----------+----------+--------------+ Gastroc  None                                         Acute          +---------+---------------+---------+-----------+----------+--------------+ GSV      Partial        Yes      Yes                  Acute          +---------+---------------+---------+-----------+----------+--------------+ VV       None           No       No                   Acute          +---------+---------------+---------+-----------+----------+--------------+     Summary: RIGHT: - No evidence of common femoral vein obstruction.  LEFT: - Findings consistent with acute deep vein thrombosis involving the left gastrocnemius veins.  - Findings consistent with acute superficial vein thrombosis involving the left great saphenous vein from the level of the mid thigh to the calf, and left varicosities or other superficial veins.  - No cystic structure found in the popliteal fossa.  *See table(s) above for measurements and observations. Electronically signed by Harold Barban MD on 05/02/2021 at 37:56:46 PM.    Final     Procedures Procedures   Medications Ordered in ED Medications  rivaroxaban Alveda Reasons) Education Kit for DVT/PE patients (has no administration in time range)  apixaban (ELIQUIS) tablet 10 mg (has no administration in time range)  apixaban (ELIQUIS) Education Kit for DVT/PE patients (has no administration in time range)  iohexol (OMNIPAQUE) 350 MG/ML injection 100 mL (65 mLs Intravenous Contrast Given 05/02/21 2048)    ED Course  I have reviewed the triage vital signs and the nursing notes.  Pertinent labs & imaging results that were  available during my care of the patient were reviewed by me and considered in  my medical decision making (see chart for details).     MDM Rules/Calculators/A&P                         Positive venous doppler of left calf prior to arrival in department. Findings significant for acute DVT of left gastrocnemius and superficial VT of left great saphenous vein. Chest pain / tightness and shortness of breath over the last three days suspicious for probable PE. CTA of lungs ordered while patient was in triage showed: "Acute to subacute bilateral lower lobe and RIGHT upper lobe segmental and sub segmental pulmonary thromboemboli. Overall clot burden is mild. No RIGHT ventricular strain. 2. No pulmonary infarction." PESI score indicates class I, very low risk. Physical exam unremarkable other than erythema and tenderness of the left calf. Patient SpO2 between 99 and 100% on RA both walking and at rest. Patient normotensive and normal rate. Patient has active PCP with whom he follows. EKG with sinus rhythm. Given low risk category, strong support from PCP, and excellent clinical condition on examination today, discussed patient to begin oral anticoagulation with Eliquis. Patient educated on beginning an oral anticoagulation agent and given first dose while in the department today. Patient to follow up with PCP at earliest convenience for post-hospital follow up. Patient should return to the emergency department if he experiences increasing chest pain, shortness of breath, hemoptysis, syncope. Final Clinical Impression(s) / ED Diagnoses Final diagnoses:  None    Rx / DC Orders ED Discharge Orders     None        Dorien Chihuahua 05/02/21 2226    Davonna Belling, MD 05/02/21 2306

## 2021-05-02 NOTE — Discharge Instructions (Addendum)
We discussed your CT findings which showed a PE with mild clot burden. We discussed this was caused by the DVT in your left calf that was diagnosed by your PCP earlier today. In order to prevent further harm to your lungs or the veins in your legs, we are beginning an anticoagulant, Eliquis, as we discussed. It is important for you to begin taking this medication immediately and not miss any doses. Please pick up this prescription from the pharmacy at your earliest convenience. Please follow up with your PCP to discuss this hospital visit.

## 2021-05-03 ENCOUNTER — Telehealth: Payer: Self-pay | Admitting: *Deleted

## 2021-05-03 NOTE — Telephone Encounter (Signed)
Pt wife called regarding where to pick up Eliquis Rx.  RNCM advised that is it not an OTC Rx, she needs to speak with pharmacist to have Rx filled.

## 2021-05-24 DIAGNOSIS — I824Z2 Acute embolism and thrombosis of unspecified deep veins of left distal lower extremity: Secondary | ICD-10-CM | POA: Diagnosis not present

## 2021-05-31 ENCOUNTER — Telehealth: Payer: Self-pay | Admitting: Physician Assistant

## 2021-05-31 NOTE — Telephone Encounter (Signed)
Pt called back to sch appt per 9/7 referral. Pt is aware of appt date and time.  

## 2021-06-06 ENCOUNTER — Inpatient Hospital Stay: Payer: 59

## 2021-06-06 ENCOUNTER — Other Ambulatory Visit: Payer: Self-pay

## 2021-06-06 ENCOUNTER — Encounter: Payer: Self-pay | Admitting: Physician Assistant

## 2021-06-06 ENCOUNTER — Inpatient Hospital Stay: Payer: 59 | Attending: Physician Assistant | Admitting: Physician Assistant

## 2021-06-06 VITALS — BP 112/74 | HR 82 | Temp 97.9°F | Resp 17 | Ht 68.0 in | Wt 219.9 lb

## 2021-06-06 DIAGNOSIS — R079 Chest pain, unspecified: Secondary | ICD-10-CM | POA: Insufficient documentation

## 2021-06-06 DIAGNOSIS — Z833 Family history of diabetes mellitus: Secondary | ICD-10-CM | POA: Insufficient documentation

## 2021-06-06 DIAGNOSIS — Z86718 Personal history of other venous thrombosis and embolism: Secondary | ICD-10-CM

## 2021-06-06 DIAGNOSIS — I2699 Other pulmonary embolism without acute cor pulmonale: Secondary | ICD-10-CM | POA: Insufficient documentation

## 2021-06-06 DIAGNOSIS — Z7901 Long term (current) use of anticoagulants: Secondary | ICD-10-CM | POA: Insufficient documentation

## 2021-06-06 DIAGNOSIS — Z8619 Personal history of other infectious and parasitic diseases: Secondary | ICD-10-CM | POA: Insufficient documentation

## 2021-06-06 DIAGNOSIS — Z79899 Other long term (current) drug therapy: Secondary | ICD-10-CM | POA: Insufficient documentation

## 2021-06-06 DIAGNOSIS — M7989 Other specified soft tissue disorders: Secondary | ICD-10-CM | POA: Diagnosis not present

## 2021-06-06 DIAGNOSIS — E785 Hyperlipidemia, unspecified: Secondary | ICD-10-CM | POA: Insufficient documentation

## 2021-06-06 DIAGNOSIS — Z8249 Family history of ischemic heart disease and other diseases of the circulatory system: Secondary | ICD-10-CM | POA: Insufficient documentation

## 2021-06-06 LAB — CBC WITH DIFFERENTIAL (CANCER CENTER ONLY)
Abs Immature Granulocytes: 0.02 10*3/uL (ref 0.00–0.07)
Basophils Absolute: 0.1 10*3/uL (ref 0.0–0.1)
Basophils Relative: 1 %
Eosinophils Absolute: 0.2 10*3/uL (ref 0.0–0.5)
Eosinophils Relative: 4 %
HCT: 45.1 % (ref 39.0–52.0)
Hemoglobin: 15.1 g/dL (ref 13.0–17.0)
Immature Granulocytes: 0 %
Lymphocytes Relative: 41 %
Lymphs Abs: 2.7 10*3/uL (ref 0.7–4.0)
MCH: 30.4 pg (ref 26.0–34.0)
MCHC: 33.5 g/dL (ref 30.0–36.0)
MCV: 90.9 fL (ref 80.0–100.0)
Monocytes Absolute: 0.5 10*3/uL (ref 0.1–1.0)
Monocytes Relative: 8 %
Neutro Abs: 3 10*3/uL (ref 1.7–7.7)
Neutrophils Relative %: 46 %
Platelet Count: 335 10*3/uL (ref 150–400)
RBC: 4.96 MIL/uL (ref 4.22–5.81)
RDW: 13.5 % (ref 11.5–15.5)
WBC Count: 6.6 10*3/uL (ref 4.0–10.5)
nRBC: 0 % (ref 0.0–0.2)

## 2021-06-06 LAB — CMP (CANCER CENTER ONLY)
ALT: 26 U/L (ref 0–44)
AST: 24 U/L (ref 15–41)
Albumin: 4.4 g/dL (ref 3.5–5.0)
Alkaline Phosphatase: 77 U/L (ref 38–126)
Anion gap: 10 (ref 5–15)
BUN: 17 mg/dL (ref 6–20)
CO2: 24 mmol/L (ref 22–32)
Calcium: 9.7 mg/dL (ref 8.9–10.3)
Chloride: 105 mmol/L (ref 98–111)
Creatinine: 1.12 mg/dL (ref 0.61–1.24)
GFR, Estimated: >60 mL/min (ref >60)
Glucose, Bld: 96 mg/dL (ref 70–99)
Potassium: 4.5 mmol/L (ref 3.5–5.1)
Sodium: 139 mmol/L (ref 135–145)
Total Bilirubin: 1.5 mg/dL — ABNORMAL HIGH (ref 0.3–1.2)
Total Protein: 7.5 g/dL (ref 6.5–8.1)

## 2021-06-06 NOTE — Progress Notes (Signed)
[facility] [phone]   [phone]  INITIAL CONSULT NOTE  Patient Care Team: Gillian Scarce, MD as PCP - General (Family Medicine)  Hematological/Oncological History 1) 05/02/2021: Presented to ED with left lower leg edema, chest pain and shortness of breath.  -Doppler US: Acute DVT involving left gastrocnemius veins. Acute superficial vein thrombosis involving left great saphenous vein from the level of the mid thigh to the calf, and  left varicosities or other superficial veins.  -CTA chest revealed acute to subacute bilateral lower lobe and RIGHT upper lobe segmental and sub segmental pulmonary thromboemboli. Overall clot burden is mild. -Started anticoagulation with Eliquis.   2) 06/06/2021: Establish care with Georga Kaufmann PA-C  CHIEF COMPLAINTS/PURPOSE OF CONSULTATION:  "PE and LLE DVT "  HISTORY OF PRESENTING ILLNESS:  Antionette Poles 45 y.o. male with medical history significant for hyperlipidemia and history of lyme disease. Patient is unaccompanied for this visit.   On exam today, Mr. Soulliere reports that his energy and appetite are overall stable. He is able to complete her ADLs on his own. He reports that his shortness of breath, chest pain and left leg swelling has significantly improved. He is trying to stay active and goes for a walk every day. He is tolerating Eliquis without any toxicities including bleeding. He denies nausea,vomiting or abdominal pain. His bowel movements are regular without any diarrhea or constipation. He has no other complaints. Rest of the 10 point ROS is below.   MEDICAL HISTORY:  Past Medical History:  Diagnosis Date   Hyperlipidemia    Lyme disease 2004    SURGICAL HISTORY: Past Surgical History:  Procedure Laterality Date   VASECTOMY      SOCIAL HISTORY: Social History   Socioeconomic History   Marital status: Married    Spouse name: Tresa Endo    Number of children: 2   Years of education: 12   Highest  education level: Not on file  Occupational History   Not on file  Tobacco Use   Smoking status: Never   Smokeless tobacco: Never  Substance and Sexual Activity   Alcohol use: No   Drug use: No   Sexual activity: Yes    Partners: Female  Other Topics Concern   Not on file  Social History Narrative   Marital Status: Married Heritage manager)    Children:  Daughter (Danielle) Son Reuel Boom)    Pets: None    Living Situation: Lives with wife and children    Occupation:  Emergency planning/management officer - Corporate treasurer)    Education: Engineer, agricultural    Tobacco Use/Exposure:  None    Alcohol Use:  None    Drug Use:  None   Diet:  Regular   Exercise:  Limited    Hobbies:  Psychologist, forensic               Social Determinants of Corporate investment banker Strain: Not on file  Food Insecurity: Not on file  Transportation Needs: Not on file  Physical Activity: Not on file  Stress: Not on file  Social Connections: Not on file  Intimate Partner Violence: Not on file    FAMILY HISTORY: Family History  Problem Relation Age of Onset   Thrombosis Father    Diabetes Father     ALLERGIES:  has No Known Allergies.  MEDICATIONS:  Current Outpatient Medications  Medication Sig Dispense Refill   ELIQUIS 5 MG TABS tablet Take 5 mg by mouth 2 (two)  times daily.     No current facility-administered medications for this visit.    REVIEW OF SYSTEMS:   Constitutional: ( - ) fevers, ( - )  chills , ( - ) night sweats Eyes: ( - ) blurriness of vision, ( - ) double vision, ( - ) watery eyes Ears, nose, mouth, throat, and face: ( - ) mucositis, ( - ) sore throat Respiratory: ( - ) cough, ( - ) dyspnea, ( - ) wheezes Cardiovascular: ( - ) palpitation, ( - ) chest discomfort, ( - ) lower extremity swelling Gastrointestinal:  ( - ) nausea, ( - ) heartburn, ( - ) change in bowel habits Skin: ( - ) abnormal skin rashes Lymphatics: ( - ) new lymphadenopathy, ( - ) easy bruising Neurological: ( - )  numbness, ( - ) tingling, ( - ) new weaknesses Behavioral/Psych: ( - ) mood change, ( - ) new changes  All other systems were reviewed with the patient and are negative.  PHYSICAL EXAMINATION: ECOG PERFORMANCE STATUS: 0 - Asymptomatic  Vitals:   06/06/21 1110  BP: 112/74  Pulse: 82  Resp: 17  Temp: 97.9 F (36.6 C)  SpO2: 100%   Filed Weights   06/06/21 1110  Weight: 219 lb 14.4 oz (99.7 kg)    GENERAL: well appearing male in NAD  SKIN: skin color, texture, turgor are normal, no rashes or significant lesions EYES: conjunctiva are pink and non-injected, sclera clear OROPHARYNX: no exudate, no erythema; lips, buccal mucosa, and tongue normal  NECK: supple, non-tender LYMPH:  no palpable lymphadenopathy in the cervical, axillary or supraclavicular lymph nodes.  LUNGS: clear to auscultation and percussion with normal breathing effort HEART: regular rate & rhythm and no murmurs and no lower extremity edema ABDOMEN: soft, non-tender, non-distended, normal bowel sounds Musculoskeletal: no cyanosis of digits and no clubbing  PSYCH: alert & oriented x 3, fluent speech NEURO: no focal motor/sensory deficits  LABORATORY DATA:  I have reviewed the data as listed CBC Latest Ref Rng & Units 06/06/2021 05/02/2021 01/31/2014  WBC 4.0 - 10.5 K/uL 6.6 9.1 6.1  Hemoglobin 13.0 - 17.0 g/dL 44.0 34.7 42.5  Hematocrit 39.0 - 52.0 % 45.1 47.1 42.1  Platelets 150 - 400 K/uL 335 331 369    CMP Latest Ref Rng & Units 06/06/2021 05/02/2021 01/31/2014  Glucose 70 - 99 mg/dL 96 956(L) 875(I)  BUN 6 - 20 mg/dL 17 20 20   Creatinine 0.61 - 1.24 mg/dL 4.33 2.95  Sodium 135 - 145 mmol/L 139 137 140  Potassium 3.5 - 5.1 mmol/L 4.5 3.7 4.3  Chloride 98 - 111 mmol/L 105 106 105  CO2 22 - 32 mmol/L 24 24 22   Calcium 8.9 - 10.3 mg/dL 9.7 9.7 9.2  Total Protein 6.5 - 8.1 g/dL 7.5 - 6.7  Total Bilirubin 0.3 - 1.2 mg/dL 1.88) - 0.9  Alkaline Phos 38 - 126 U/L 77 - 58  AST 15 - 41 U/L 24 - 18  ALT 0 -  44 U/L 26 - 16   ASSESSMENT & PLAN Westlee Gartman is a 45 y.o. male who presents to the clinic for evaluation for recent bilateral PE and left leg DVT. Patient is currently on Eliquis with good tolerance. Reviewed provoking factors including recent travel, sedentary lifestyle, surgery, hormone therapy, trauma, infection and smoking. Patient traveled from [place] a few days prior to presenting with his symptoms. This is likely the provoking factor so we recommend anticoagulation for 6 months. He will proceed with  labs today to check CBC and CMP. Due to family history of thrombosis, we will check Factor 5 Leiden and Prothrombin Gene mutation status.   #Bilateral PE and LLE DVT: --Provoked by recent travel to [place] --Recommend anticoagulation with Eliquis x 6 months --Labs today to check CBC, CMP, Factor 5 Leiden and Prothrombin mutation status --RTC in 5 months with repeat labs.   Orders Placed This Encounter  Procedures   CBC with Differential (Cancer Center Only)    Standing Status:   Future    Number of Occurrences:   1    Standing Expiration Date:   06/06/2022   CMP (Cancer Center only)    Standing Status:   Future    Number of Occurrences:   1    Standing Expiration Date:   06/06/2022   Prothrombin gene mutation*    Standing Status:   Future    Number of Occurrences:   1    Standing Expiration Date:   06/06/2022   Factor 5 Leiden*    Standing Status:   Future    Number of Occurrences:   1    Standing Expiration Date:   06/06/2022    All questions were answered. The patient knows to call the clinic with any problems, questions or concerns.  I have spent a total of 60 minutes minutes of face-to-face and non-face-to-face time, preparing to see the patient, obtaining and/or reviewing separately obtained history, performing a medically appropriate examination, counseling and educating the patient, ordering tests, documenting clinical information in the electronic health record, and care  coordination.   Georga Kaufmann, PA-C Department of Hematology/Oncology [facility] Phone: [phone]  Patient was seen with Dr. Leonides Schanz.   I have read the above note and personally examined the patient. I agree with the assessment and plan as noted above.  Briefly Mr. Cloward is a 45 year old male who presents for evaluation of a provoked DVT and PE. These occurred after a flight from [place] to [place]. Patient has family history of VTE. Given his young age will order a focused hypercoagulation workup. Likely will require only a limited duration of anticoagulation, anticipate 6 months of full dose therapy. RTC in 5 months to discuss next steps.    Ulysees Barns, MD Department of Hematology/Oncology [facility] Phone: [phone] Pager: [phone] Email: [email or web address]

## 2021-06-11 LAB — FACTOR 5 LEIDEN

## 2021-06-12 ENCOUNTER — Telehealth: Payer: Self-pay

## 2021-06-12 LAB — PROTHROMBIN GENE MUTATION

## 2021-06-12 NOTE — Telephone Encounter (Signed)
I left a detailed message on the patients voicemail per DPR informing him of his lab results and Rolland Porter recommendations. I advised the patient to contact us with any questions or concerns.

## 2021-06-12 NOTE — Telephone Encounter (Signed)
-----   Message from Briant Cedar, PA-C sent at 06/12/2021 12:54 PM EDT ----- Please call patient and let him know that labs from last week were reviewed. No evidence of inheritable gene mutation that caused his blood clot. Recommend to continue on blood thinner x 6 months as discussed during visit.

## 2021-11-06 ENCOUNTER — Other Ambulatory Visit: Payer: Self-pay | Admitting: Physician Assistant

## 2021-11-06 DIAGNOSIS — Z86718 Personal history of other venous thrombosis and embolism: Secondary | ICD-10-CM

## 2021-11-07 ENCOUNTER — Inpatient Hospital Stay (HOSPITAL_BASED_OUTPATIENT_CLINIC_OR_DEPARTMENT_OTHER): Payer: 59 | Admitting: Physician Assistant

## 2021-11-07 ENCOUNTER — Other Ambulatory Visit: Payer: Self-pay

## 2021-11-07 ENCOUNTER — Inpatient Hospital Stay: Payer: 59 | Attending: Physician Assistant

## 2021-11-07 VITALS — BP 114/79 | HR 84 | Temp 97.9°F | Resp 19 | Ht 68.0 in | Wt 222.7 lb

## 2021-11-07 DIAGNOSIS — Z7982 Long term (current) use of aspirin: Secondary | ICD-10-CM | POA: Insufficient documentation

## 2021-11-07 DIAGNOSIS — R0602 Shortness of breath: Secondary | ICD-10-CM | POA: Insufficient documentation

## 2021-11-07 DIAGNOSIS — Z86718 Personal history of other venous thrombosis and embolism: Secondary | ICD-10-CM | POA: Insufficient documentation

## 2021-11-07 DIAGNOSIS — Z86711 Personal history of pulmonary embolism: Secondary | ICD-10-CM | POA: Insufficient documentation

## 2021-11-07 DIAGNOSIS — Z7901 Long term (current) use of anticoagulants: Secondary | ICD-10-CM | POA: Insufficient documentation

## 2021-11-07 LAB — CMP (CANCER CENTER ONLY)
ALT: 21 U/L (ref 0–44)
AST: 17 U/L (ref 15–41)
Albumin: 4.5 g/dL (ref 3.5–5.0)
Alkaline Phosphatase: 74 U/L (ref 38–126)
Anion gap: 6 (ref 5–15)
BUN: 19 mg/dL (ref 6–20)
CO2: 25 mmol/L (ref 22–32)
Calcium: 9.6 mg/dL (ref 8.9–10.3)
Chloride: 106 mmol/L (ref 98–111)
Creatinine: 1 mg/dL (ref 0.61–1.24)
GFR, Estimated: >60 mL/min (ref >60)
Glucose, Bld: 101 mg/dL — ABNORMAL HIGH (ref 70–99)
Potassium: 4 mmol/L (ref 3.5–5.1)
Sodium: 137 mmol/L (ref 135–145)
Total Bilirubin: 0.7 mg/dL (ref 0.3–1.2)
Total Protein: 7.4 g/dL (ref 6.5–8.1)

## 2021-11-07 LAB — CBC WITH DIFFERENTIAL (CANCER CENTER ONLY)
Abs Immature Granulocytes: 0.02 10*3/uL (ref 0.00–0.07)
Basophils Absolute: 0.1 10*3/uL (ref 0.0–0.1)
Basophils Relative: 1 %
Eosinophils Absolute: 0.3 10*3/uL (ref 0.0–0.5)
Eosinophils Relative: 3 %
HCT: 42.7 % (ref 39.0–52.0)
Hemoglobin: 15.2 g/dL (ref 13.0–17.0)
Immature Granulocytes: 0 %
Lymphocytes Relative: 37 %
Lymphs Abs: 3.2 10*3/uL (ref 0.7–4.0)
MCH: 30.6 pg (ref 26.0–34.0)
MCHC: 35.6 g/dL (ref 30.0–36.0)
MCV: 85.9 fL (ref 80.0–100.0)
Monocytes Absolute: 0.6 10*3/uL (ref 0.1–1.0)
Monocytes Relative: 7 %
Neutro Abs: 4.5 10*3/uL (ref 1.7–7.7)
Neutrophils Relative %: 52 %
Platelet Count: 357 10*3/uL (ref 150–400)
RBC: 4.97 MIL/uL (ref 4.22–5.81)
RDW: 13.3 % (ref 11.5–15.5)
WBC Count: 8.7 10*3/uL (ref 4.0–10.5)
nRBC: 0 % (ref 0.0–0.2)

## 2021-11-07 NOTE — Progress Notes (Signed)
[facility] Telephone:([phone]) [phone]   Fax:([phone]) [phone]  PROGRESS NOTE  Patient Care Team: Gillian Scarce, MD as PCP - General (Family Medicine)  Hematological/Oncological History 1) 05/02/2021: Presented to ED with left lower leg edema, chest pain and shortness of breath.  -Doppler US: Acute DVT involving left gastrocnemius veins. Acute superficial vein thrombosis involving left great saphenous vein from the level of the mid thigh to the calf, and  left varicosities or other superficial veins.  -CTA chest revealed acute to subacute bilateral lower lobe and RIGHT upper lobe segmental and sub segmental pulmonary thromboemboli. Overall clot burden is mild. -Started anticoagulation with Eliquis.   2) 06/06/2021: Establish care with Dennis Kaufmann PA-C  CHIEF COMPLAINTS/PURPOSE OF CONSULTATION:  "PE and LLE DVT "  HISTORY OF PRESENTING ILLNESS:  Dennis Leach 46 y.o. male returns for a follow up visit for history of left lower extremity DVT and pulmonary embolism. Patient is unaccompanied for this visit. He was last seen on 06/06/2021 and in the interim, he has continued on Eliquis therapy.   On exam today, Dennis Leach reports that his energy and appetite are stable. He is active and completes all his daily activities independently. He reports that swelling in his left leg has resolved. He reports occasional episodes of shortness of breath with exertion. He is tolerating Eliquis without any toxicities including bleeding or bruising. He denies nausea,vomiting or abdominal pain. His bowel movements are regular without any diarrhea or constipation. He has no other complaints. Rest of the 10 point ROS is below.   MEDICAL HISTORY:  Past Medical History:  Diagnosis Date   Hyperlipidemia    Lyme disease 2004    SURGICAL HISTORY: Past Surgical History:  Procedure Laterality Date   VASECTOMY      SOCIAL HISTORY: Social History   Socioeconomic History   Marital status: Married     Spouse name: Tresa Endo    Number of children: 2   Years of education: 12   Highest education level: Not on file  Occupational History   Not on file  Tobacco Use   Smoking status: Never   Smokeless tobacco: Never  Substance and Sexual Activity   Alcohol use: No   Drug use: No   Sexual activity: Yes    Partners: Female  Other Topics Concern   Not on file  Social History Narrative   Marital Status: Married Heritage manager)    Children:  Daughter (Danielle) Son Reuel Boom)    Pets: None    Living Situation: Lives with wife and children    Occupation:  Emergency planning/management officer - Corporate treasurer)    Education: Engineer, agricultural    Tobacco Use/Exposure:  None    Alcohol Use:  None    Drug Use:  None   Diet:  Regular   Exercise:  Limited    Hobbies:  Psychologist, forensic               Social Determinants of Corporate investment banker Strain: Not on file  Food Insecurity: Not on file  Transportation Needs: Not on file  Physical Activity: Not on file  Stress: Not on file  Social Connections: Not on file  Intimate Partner Violence: Not on file    FAMILY HISTORY: Family History  Problem Relation Age of Onset   Thrombosis Father    Diabetes Father     ALLERGIES:  has No Known Allergies.  MEDICATIONS:  Current Outpatient Medications  Medication Sig Dispense Refill  ELIQUIS 5 MG TABS tablet Take 5 mg by mouth 2 (two) times daily.     No current facility-administered medications for this visit.    REVIEW OF SYSTEMS:   Constitutional: ( - ) fevers, ( - )  chills , ( - ) night sweats Eyes: ( - ) blurriness of vision, ( - ) double vision, ( - ) watery eyes Ears, nose, mouth, throat, and face: ( - ) mucositis, ( - ) sore throat Respiratory: ( - ) cough, ( - ) dyspnea, ( - ) wheezes Cardiovascular: ( - ) palpitation, ( - ) chest discomfort, ( - ) lower extremity swelling Gastrointestinal:  ( - ) nausea, ( - ) heartburn, ( - ) change in bowel habits Skin: ( - ) abnormal skin  rashes Lymphatics: ( - ) new lymphadenopathy, ( - ) easy bruising Neurological: ( - ) numbness, ( - ) tingling, ( - ) new weaknesses Behavioral/Psych: ( - ) mood change, ( - ) new changes  All other systems were reviewed with the patient and are negative.  PHYSICAL EXAMINATION: ECOG PERFORMANCE STATUS: 0 - Asymptomatic  Vitals:   11/07/21 1351  BP: 114/79  Pulse: 84  Resp: 19  Temp: 97.9 F (36.6 C)  SpO2: 100%   Filed Weights   11/07/21 1351  Weight: 222 lb 11.2 oz (101 kg)    GENERAL: well appearing male in NAD  SKIN: skin color, texture, turgor are normal, no rashes or significant lesions EYES: conjunctiva are pink and non-injected, sclera clear OROPHARYNX: no exudate, no erythema; lips, buccal mucosa, and tongue normal  NECK: supple, non-tender LYMPH:  no palpable lymphadenopathy in the cervical or supraclavicular lymph nodes.  LUNGS: clear to auscultation and percussion with normal breathing effort HEART: regular rate & rhythm and no murmurs and no lower extremity edema ABDOMEN: soft, non-tender, non-distended, normal bowel sounds Musculoskeletal: no cyanosis of digits and no clubbing  PSYCH: alert & oriented x 3, fluent speech NEURO: no focal motor/sensory deficits  LABORATORY DATA:  I have reviewed the data as listed CBC Latest Ref Rng & Units 11/07/2021 06/06/2021 05/02/2021  WBC 4.0 - 10.5 K/uL 8.7 6.6 9.1  Hemoglobin 13.0 - 17.0 g/dL 15.2 15.1 15.6  Hematocrit 39.0 - 52.0 % 42.7 45.1 47.1  Platelets 150 - 400 K/uL 357 335 331    CMP Latest Ref Rng & Units 11/07/2021 06/06/2021 05/02/2021  Glucose 70 - 99 mg/dL 101(H) 96 116(H)  BUN 6 - 20 mg/dL 19 17 20   Creatinine 0.61 - 1.24 mg/dL 1.00 1.12 1.06  Sodium 135 - 145 mmol/L 137 139 137  Potassium 3.5 - 5.1 mmol/L 4.0 4.5 3.7  Chloride 98 - 111 mmol/L 106 105 106  CO2 22 - 32 mmol/L 25 24 24   Calcium 8.9 - 10.3 mg/dL 9.6 9.7 9.7  Total Protein 6.5 - 8.1 g/dL 7.4 7.5 -  Total Bilirubin 0.3 - 1.2 mg/dL 0.7 1.5(H)  -  Alkaline Phos 38 - 126 U/L 74 77 -  AST 15 - 41 U/L 17 24 -  ALT 0 - 44 U/L 21 26 -   ASSESSMENT & PLAN Dennis Leach is a 46 y.o. male who returns for a follow up for history of bilateral PE and left leg DVT.   #Hx of bilateral PE and LLE DVT: --Provoked by travel to [place] end of July 2022 with 14 hour flight and additional 100 hours of nonambulation wile sitting in the airport. Diagnosed with DVT/PE on 05/02/2021.  --Patient has completed  six months of anticoagulation with Eliquis. Okay to discontinue after he completes his prescription for this month. Continue on ASA 81 mg once daily.  --No evidence of inheritable clotting disorders with no evidence of Factor 5 Leiden and Prothrombin mutation.  --Reviewed ways to minimize risk factors to prevent future thromboembolisms. --Labs from today were reviewed and require no intervention.  --Return to clinic as needed.    No orders of the defined types were placed in this encounter.   All questions were answered. The patient knows to call the clinic with any problems, questions or concerns.  I have spent a total of 25 minutes minutes of face-to-face and non-face-to-face time, preparing to see the patient, performing a medically appropriate examination, counseling and educating the patient,documenting clinical information in the electronic health record, and care coordination.    Dede Query, PA-C Department of Hematology/Oncology [place] [facility] Phone: [phone]

## 2022-02-15 IMAGING — CT CT ANGIO CHEST
2 of 7 series · 17 of 46 positions shown · IV contrast (APPLIED)
Comparison: None.
COMPARISON: None.

Addendum:
CLINICAL DATA: High probability PE.  Short of breath

EXAM:
CT ANGIOGRAPHY CHEST WITH CONTRAST
TECHNIQUE: Multidetector CT imaging of the chest was performed using the
standard protocol during bolus administration of intravenous
contrast. Multiplanar CT image reconstructions and MIPs were
obtained to evaluate the vascular anatomy.
CONTRAST:  65mL OMNIPAQUE IOHEXOL 350 MG/ML SOLN

[Series 7: thins · axial · 0.65mm/px · z∈[-147,+84]mm · 14 of 372 slices shown]
[im 21/372  lung]
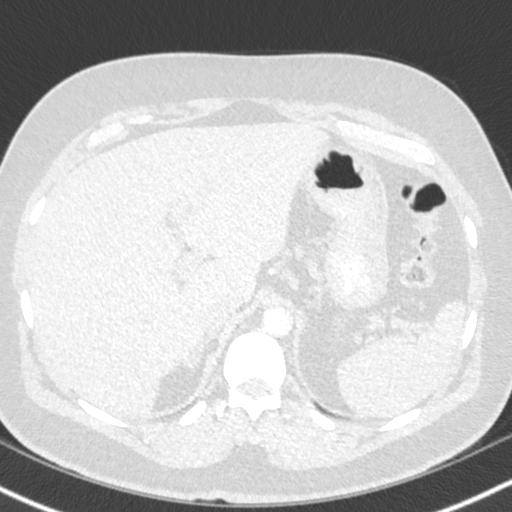
[im 42/372  soft-tissue]
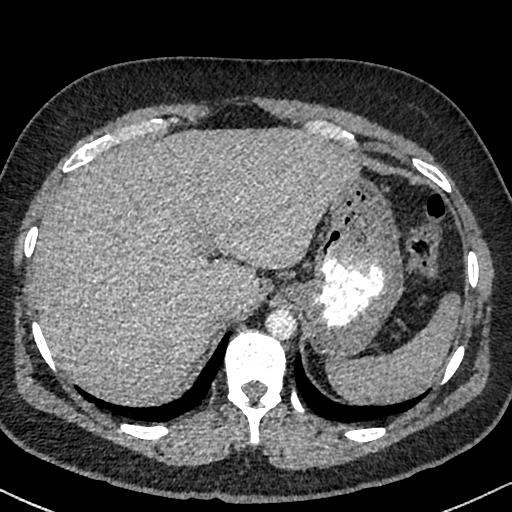
[im 83/372  lung]
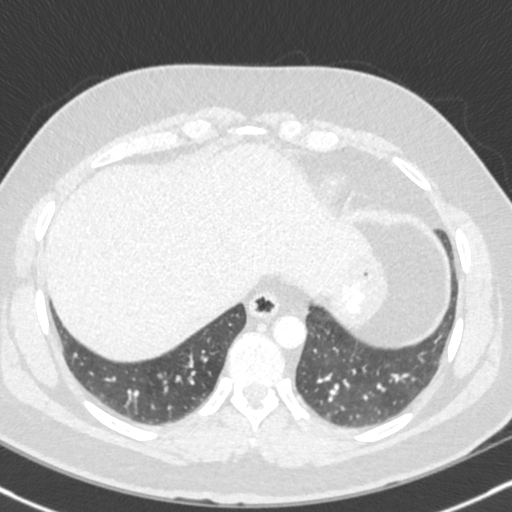
[im 104/372  soft-tissue]
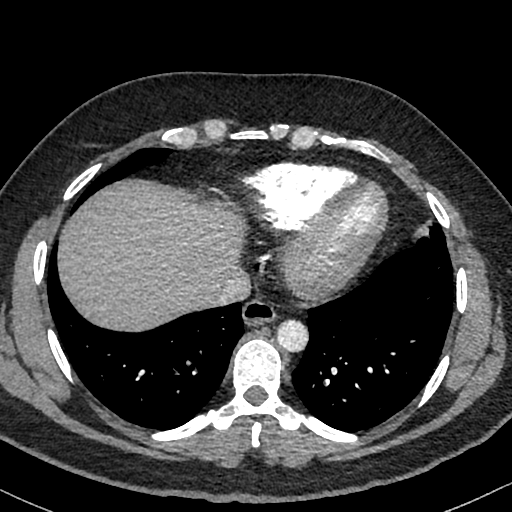
[im 124/372  lung]
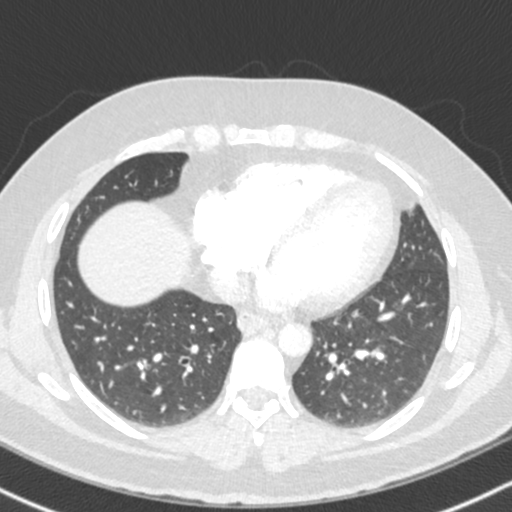
[im 145/372  soft-tissue]
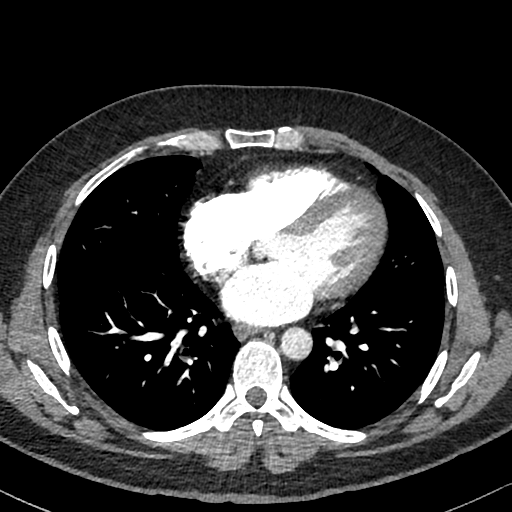
[im 165/372  lung]
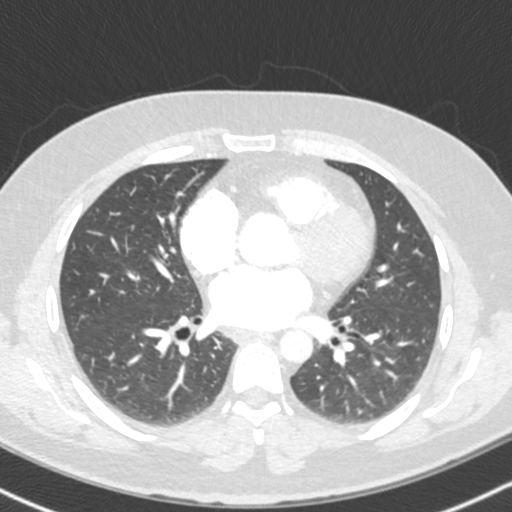
[im 207/372  soft-tissue]
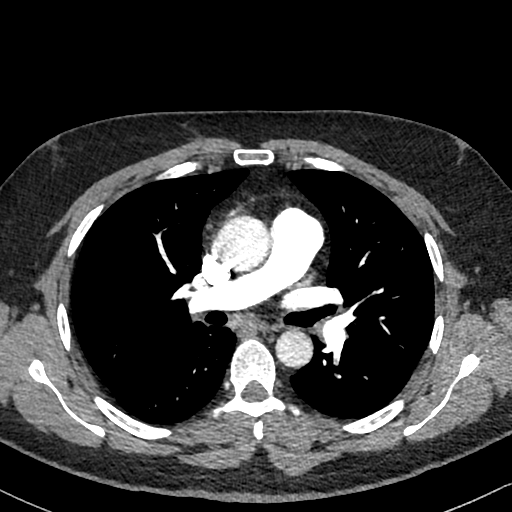
[im 227/372  lung]
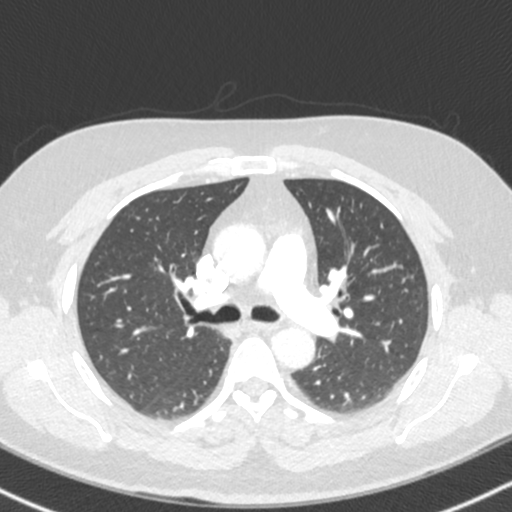
[im 248/372  soft-tissue]
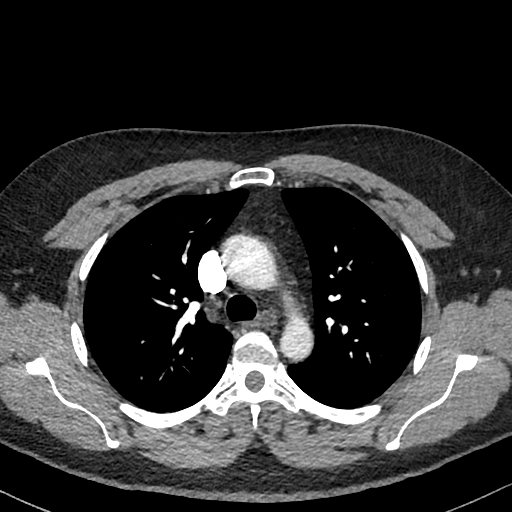
[im 268/372  lung]
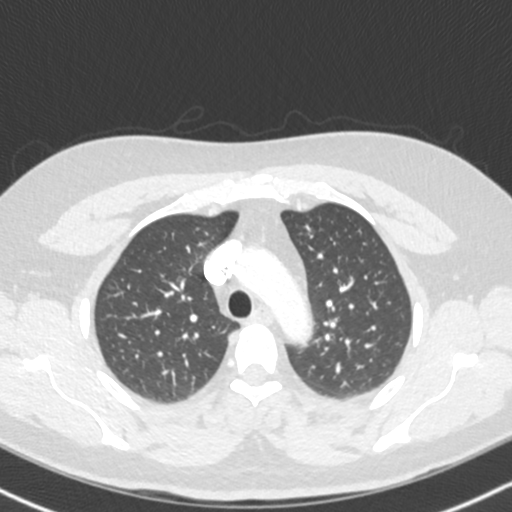
[im 289/372  soft-tissue]
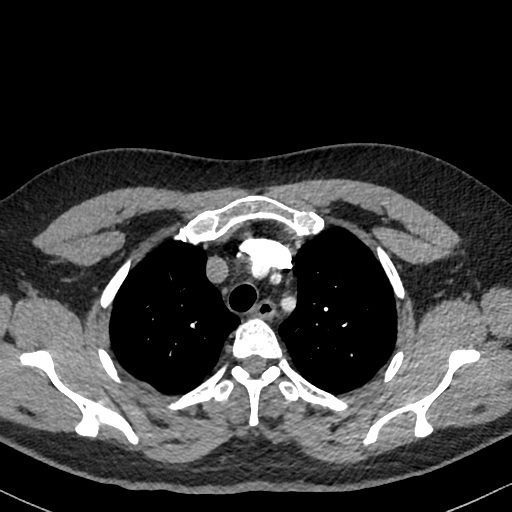
[im 330/372  lung]
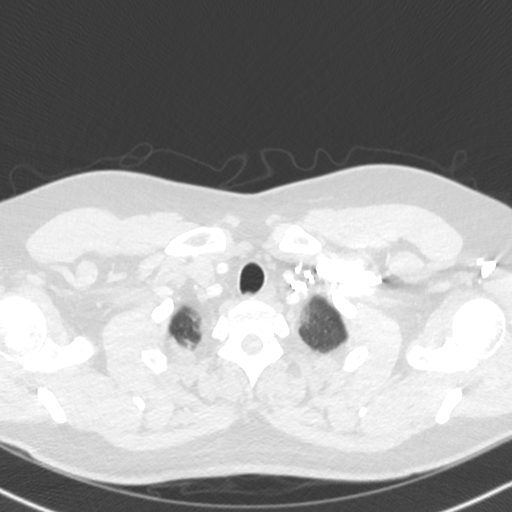
[im 351/372  soft-tissue]
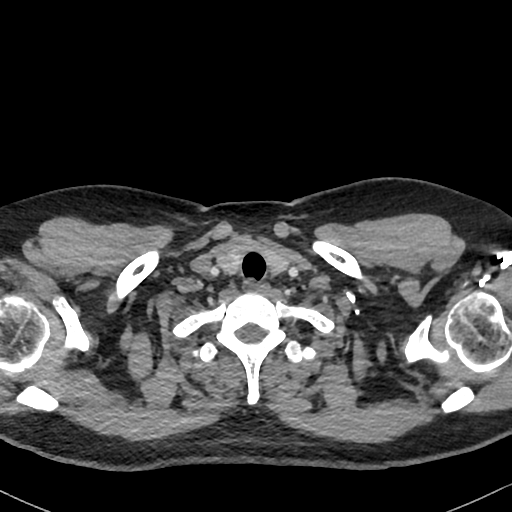

[Series 11: cor · coronal · 0.55mm/px · 3 of 129 slices shown]
[im 33/129  soft-tissue]
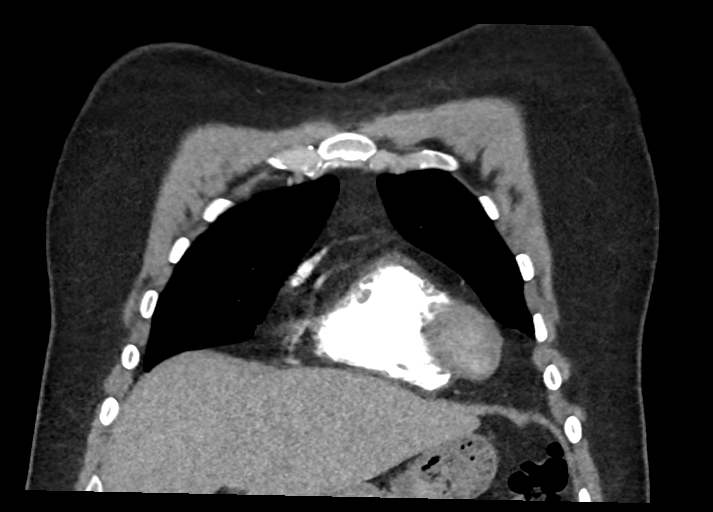
[im 65/129  soft-tissue]
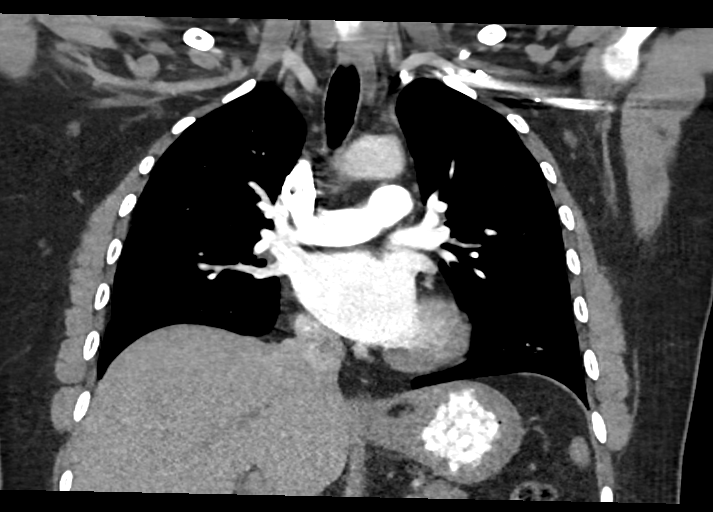
[im 97/129  soft-tissue]
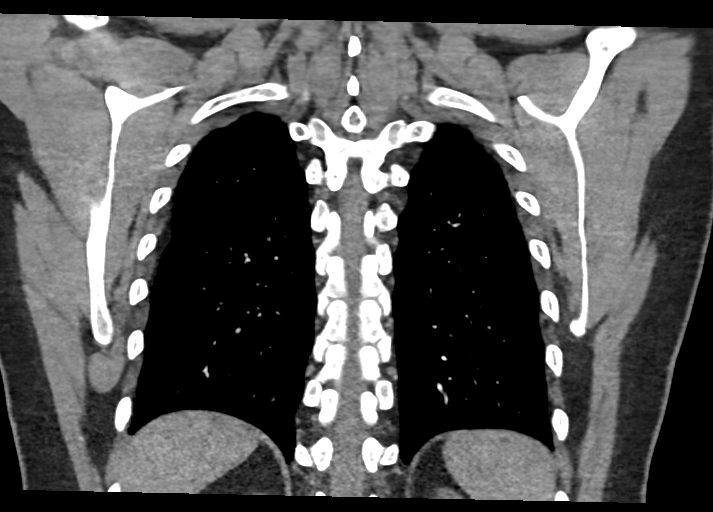

[17 of 46 positions shown; findings below may reference images not displayed]

FINDINGS: Cardiovascular: Tubular filling defects within the proximal
segmental branches of the RIGHT lower lobe (image 182/7). Portions
of the thrombus are wall adherent (image 188/7). Filling defect
within the distal subsegmental branches of the LEFT lower lobe
(image 269/7). Small filling defect within a RIGHT upper lobe
pulmonary artery on image 141.

overall the clot burden is mild and partially occlusive.

No CT evidence of RIGHT ventricular strain.

Mediastinum/Nodes: No axillary or supraclavicular adenopathy. No
mediastinal hilar adenopathy no pericardial effusion.

Lungs/Pleura: No pulmonary infarction.  Airways normal.

Upper Abdomen: Limited view of the liver, kidneys, pancreas are
unremarkable. Normal adrenal glands.

Musculoskeletal: No aggressive osseous lesion.

Review of the MIP images confirms the above findings.
IMPRESSION: 1. Acute to subacute bilateral lower lobe and RIGHT upper lobe
segmental and sub segmental pulmonary thromboemboli. Overall clot
burden is mild. No RIGHT ventricular strain.
2. No pulmonary infarction.

ADDENDUM:
Critical Value/emergent results were called by telephone at the time
of interpretation on 05/02/2021 at [DATE] to provider Sorin Oxendine,
PA, who verbally acknowledged these results.

*** End of Addendum ***
FINDINGS: Cardiovascular: Tubular filling defects within the proximal
segmental branches of the RIGHT lower lobe (image 182/7). Portions
of the thrombus are wall adherent (image 188/7). Filling defect
within the distal subsegmental branches of the LEFT lower lobe
(image 269/7). Small filling defect within a RIGHT upper lobe
pulmonary artery on image 141.

overall the clot burden is mild and partially occlusive.

No CT evidence of RIGHT ventricular strain.

Mediastinum/Nodes: No axillary or supraclavicular adenopathy. No
mediastinal hilar adenopathy no pericardial effusion.

Lungs/Pleura: No pulmonary infarction.  Airways normal.

Upper Abdomen: Limited view of the liver, kidneys, pancreas are
unremarkable. Normal adrenal glands.

Musculoskeletal: No aggressive osseous lesion.

Review of the MIP images confirms the above findings.
IMPRESSION: 1. Acute to subacute bilateral lower lobe and RIGHT upper lobe
segmental and sub segmental pulmonary thromboemboli. Overall clot
burden is mild. No RIGHT ventricular strain.
2. No pulmonary infarction.

## 2022-05-08 DIAGNOSIS — I8290 Acute embolism and thrombosis of unspecified vein: Secondary | ICD-10-CM | POA: Diagnosis not present

## 2022-05-08 DIAGNOSIS — Z0000 Encounter for general adult medical examination without abnormal findings: Secondary | ICD-10-CM | POA: Diagnosis not present

## 2022-05-08 DIAGNOSIS — R7303 Prediabetes: Secondary | ICD-10-CM | POA: Diagnosis not present

## 2022-05-08 DIAGNOSIS — Z1211 Encounter for screening for malignant neoplasm of colon: Secondary | ICD-10-CM | POA: Diagnosis not present

## 2022-05-08 DIAGNOSIS — Z13220 Encounter for screening for lipoid disorders: Secondary | ICD-10-CM | POA: Diagnosis not present

## 2022-05-10 ENCOUNTER — Other Ambulatory Visit: Payer: Self-pay | Admitting: Family Medicine

## 2022-05-10 ENCOUNTER — Other Ambulatory Visit (HOSPITAL_COMMUNITY): Payer: Self-pay | Admitting: Family Medicine

## 2022-05-10 DIAGNOSIS — E7849 Other hyperlipidemia: Secondary | ICD-10-CM

## 2022-05-21 ENCOUNTER — Ambulatory Visit
Admission: RE | Admit: 2022-05-21 | Discharge: 2022-05-21 | Disposition: A | Discharge status: Home / Self Care | Payer: 59 | Source: Ambulatory Visit | Attending: Family Medicine | Admitting: Family Medicine

## 2022-05-21 DIAGNOSIS — E7849 Other hyperlipidemia: Secondary | ICD-10-CM | POA: Insufficient documentation

## 2022-07-26 ENCOUNTER — Ambulatory Visit: Payer: Self-pay

## 2022-07-26 NOTE — Patient Outreach (Signed)
  Care Coordination   07/26/2022 Name: Dennis Leach MRN: [id number] DOB: Nov 13, 1975   Care Coordination Outreach Attempts:  An unsuccessful telephone outreach was attempted today to offer the patient information about available care coordination services as a benefit of their health plan.   Follow Up Plan:  Additional outreach attempts will be made to offer the patient care coordination information and services.   Encounter Outcome:  No Answer  Care Coordination Interventions Activated:  No   Care Coordination Interventions:  No, not indicated    Daneen Schick, BSW, CDP Social Worker, Certified Dementia Practitioner [facility] Care Management  Care Coordination [phone]

## 2022-11-11 DIAGNOSIS — R7303 Prediabetes: Secondary | ICD-10-CM | POA: Diagnosis not present

## 2023-01-13 ENCOUNTER — Other Ambulatory Visit (HOSPITAL_BASED_OUTPATIENT_CLINIC_OR_DEPARTMENT_OTHER): Payer: Self-pay | Admitting: Family Medicine

## 2023-01-13 ENCOUNTER — Ambulatory Visit (HOSPITAL_BASED_OUTPATIENT_CLINIC_OR_DEPARTMENT_OTHER)
Admission: RE | Admit: 2023-01-13 | Discharge: 2023-01-13 | Disposition: A | Discharge status: Home / Self Care | Payer: No Typology Code available for payment source | Source: Ambulatory Visit | Attending: Family Medicine | Admitting: Family Medicine

## 2023-01-13 DIAGNOSIS — M79662 Pain in left lower leg: Secondary | ICD-10-CM | POA: Diagnosis not present

## 2023-01-13 DIAGNOSIS — R0683 Snoring: Secondary | ICD-10-CM | POA: Diagnosis not present

## 2023-01-13 DIAGNOSIS — I82409 Acute embolism and thrombosis of unspecified deep veins of unspecified lower extremity: Secondary | ICD-10-CM | POA: Diagnosis not present

## 2023-01-13 DIAGNOSIS — I82402 Acute embolism and thrombosis of unspecified deep veins of left lower extremity: Secondary | ICD-10-CM

## 2023-01-13 DIAGNOSIS — M7989 Other specified soft tissue disorders: Secondary | ICD-10-CM | POA: Diagnosis not present

## 2023-01-13 DIAGNOSIS — R0602 Shortness of breath: Secondary | ICD-10-CM | POA: Diagnosis not present

## 2023-05-27 DIAGNOSIS — R0602 Shortness of breath: Secondary | ICD-10-CM | POA: Diagnosis not present

## 2023-05-27 DIAGNOSIS — E7800 Pure hypercholesterolemia, unspecified: Secondary | ICD-10-CM | POA: Diagnosis not present

## 2023-05-27 DIAGNOSIS — R7303 Prediabetes: Secondary | ICD-10-CM | POA: Diagnosis not present

## 2023-05-27 DIAGNOSIS — Z0000 Encounter for general adult medical examination without abnormal findings: Secondary | ICD-10-CM | POA: Diagnosis not present

## 2023-05-27 DIAGNOSIS — Z23 Encounter for immunization: Secondary | ICD-10-CM | POA: Diagnosis not present

## 2023-05-27 DIAGNOSIS — I8290 Acute embolism and thrombosis of unspecified vein: Secondary | ICD-10-CM | POA: Diagnosis not present

## 2023-05-27 DIAGNOSIS — Z1211 Encounter for screening for malignant neoplasm of colon: Secondary | ICD-10-CM | POA: Diagnosis not present

## 2023-05-28 ENCOUNTER — Other Ambulatory Visit (HOSPITAL_COMMUNITY): Payer: Self-pay | Admitting: Family Medicine

## 2023-05-28 DIAGNOSIS — R0602 Shortness of breath: Secondary | ICD-10-CM

## 2023-06-04 DIAGNOSIS — G4730 Sleep apnea, unspecified: Secondary | ICD-10-CM | POA: Diagnosis not present

## 2023-06-04 DIAGNOSIS — R7303 Prediabetes: Secondary | ICD-10-CM | POA: Diagnosis not present

## 2023-06-04 DIAGNOSIS — R635 Abnormal weight gain: Secondary | ICD-10-CM | POA: Diagnosis not present

## 2023-06-04 DIAGNOSIS — Z6834 Body mass index (BMI) 34.0-34.9, adult: Secondary | ICD-10-CM | POA: Diagnosis not present

## 2023-06-04 DIAGNOSIS — G4733 Obstructive sleep apnea (adult) (pediatric): Secondary | ICD-10-CM | POA: Diagnosis not present

## 2023-06-04 DIAGNOSIS — E669 Obesity, unspecified: Secondary | ICD-10-CM | POA: Diagnosis not present

## 2023-06-09 ENCOUNTER — Encounter: Payer: Self-pay | Admitting: Oncology

## 2023-06-09 ENCOUNTER — Inpatient Hospital Stay: Payer: No Typology Code available for payment source

## 2023-06-09 ENCOUNTER — Other Ambulatory Visit: Payer: Self-pay | Admitting: Oncology

## 2023-06-09 ENCOUNTER — Inpatient Hospital Stay: Payer: No Typology Code available for payment source | Attending: Oncology | Admitting: Oncology

## 2023-06-09 VITALS — BP 107/78 | HR 80 | Temp 96.1°F | Resp 18 | Wt 236.2 lb

## 2023-06-09 DIAGNOSIS — Z86718 Personal history of other venous thrombosis and embolism: Secondary | ICD-10-CM | POA: Insufficient documentation

## 2023-06-09 DIAGNOSIS — Z86711 Personal history of pulmonary embolism: Secondary | ICD-10-CM | POA: Insufficient documentation

## 2023-06-09 DIAGNOSIS — E785 Hyperlipidemia, unspecified: Secondary | ICD-10-CM | POA: Diagnosis not present

## 2023-06-09 DIAGNOSIS — Z7901 Long term (current) use of anticoagulants: Secondary | ICD-10-CM | POA: Diagnosis not present

## 2023-06-09 DIAGNOSIS — I87009 Postthrombotic syndrome without complications of unspecified extremity: Secondary | ICD-10-CM | POA: Insufficient documentation

## 2023-06-09 LAB — CBC WITH DIFFERENTIAL/PLATELET
Abs Immature Granulocytes: 0.03 10*3/uL (ref 0.00–0.07)
Basophils Absolute: 0.1 10*3/uL (ref 0.0–0.1)
Basophils Relative: 1 %
Eosinophils Absolute: 0.2 10*3/uL (ref 0.0–0.5)
Eosinophils Relative: 3 %
HCT: 44.6 % (ref 39.0–52.0)
Hemoglobin: 15.1 g/dL (ref 13.0–17.0)
Immature Granulocytes: 0 %
Lymphocytes Relative: 39 %
Lymphs Abs: 2.8 10*3/uL (ref 0.7–4.0)
MCH: 30.3 pg (ref 26.0–34.0)
MCHC: 33.9 g/dL (ref 30.0–36.0)
MCV: 89.6 fL (ref 80.0–100.0)
Monocytes Absolute: 0.5 10*3/uL (ref 0.1–1.0)
Monocytes Relative: 7 %
Neutro Abs: 3.6 10*3/uL (ref 1.7–7.7)
Neutrophils Relative %: 50 %
Platelets: 335 10*3/uL (ref 150–400)
RBC: 4.98 MIL/uL (ref 4.22–5.81)
RDW: 13.6 % (ref 11.5–15.5)
WBC: 7.1 10*3/uL (ref 4.0–10.5)
nRBC: 0 % (ref 0.0–0.2)

## 2023-06-09 NOTE — Assessment & Plan Note (Addendum)
He was tested negative for factor V Leiden mutation and prothrombin gene mutation. His previous DVT and PE episode was provoked by immobilization from air flights. Recommend patient to take prophylactic Eliquis 2.5 mg twice daily, if he anticipate any upcoming immobilization events-i.e., road trip, flights, postsurgical setting etc. He was referred to hematology for hypercoagulable workup. I will check CBC, Antithrombin III, antiphospholipid syndrome, PNH, protein C/S, beta-2 glycoprotein,  Addendum: hypercoagulable work up is negative.

## 2023-06-09 NOTE — Assessment & Plan Note (Signed)
Likely insufficiency, secondary to previous left lower extremity  Recommend leg ambulation, compression stocking. Refer to vascular surgery

## 2023-06-09 NOTE — Progress Notes (Addendum)
Hematology/Oncology Consult note [phone] [phone]        REFERRING PROVIDER: Shon Hale, *   CHIEF COMPLAINTS/REASON FOR VISIT:  Evaluation of DVT   ASSESSMENT & PLAN:   Personal history of venous thrombosis and embolism He was tested negative for factor V Leiden mutation and prothrombin gene mutation. His previous DVT and PE episode was provoked by immobilization from air flights. Recommend patient to take prophylactic Eliquis 2.5 mg twice daily, if he anticipate any upcoming immobilization events-i.e., road trip, flights, postsurgical setting etc. He was referred to hematology for hypercoagulable workup. I will check CBC, Antithrombin III, antiphospholipid syndrome, PNH, protein C/S, beta-2 glycoprotein,  Addendum: hypercoagulable work up is negative.   Postthrombotic syndrome Likely insufficiency, secondary to previous left lower extremity  Recommend leg ambulation, compression stocking. Refer to vascular surgery   Orders Placed This Encounter  Procedures   CBC with Differential/Platelet    Standing Status:   Future    Number of Occurrences:   1    Standing Expiration Date:   06/08/2024   Antithrombin III    Standing Status:   Future    Number of Occurrences:   1    Standing Expiration Date:   06/08/2024   ANTIPHOSPHOLIPID SYNDROME PROF    Standing Status:   Future    Number of Occurrences:   1    Standing Expiration Date:   06/08/2024   PNH Profile (-High Sensitivity)    Standing Status:   Future    Number of Occurrences:   1    Standing Expiration Date:   06/08/2024   Protein S, total and free    Standing Status:   Future    Number of Occurrences:   1    Standing Expiration Date:   06/08/2024   Protein C activity    Standing Status:   Future    Number of Occurrences:   1    Standing Expiration Date:   06/08/2024   Beta-2-glycoprotein i abs, IgG/M/A    Standing Status:   Future    Number of Occurrences:   1    Standing  Expiration Date:   06/08/2024   Ambulatory referral to Vascular Surgery    Referral Priority:   Routine    Referral Type:   Surgical    Referral Reason:   Specialty Services Required    Requested Specialty:   Vascular Surgery    Number of Visits Requested:   1   Follow-up as needed. All questions were answered. The patient knows to call the clinic with any problems, questions or concerns.  Rickard Patience, MD, PhD [facility] Oncology 06/09/2023   HISTORY OF PRESENTING ILLNESS:   Dennis Leach is a  47 y.o.  male with PMH listed below was seen in consultation at the request of  Shon Hale, *  for evaluation of history of pulmonary embolism.   He was diagnosed with acute pulmonary embolism in 2022. 05/03/2023 patient presented to ER for evaluation of left leg pain and intermittent chest pain shortness of breath. Ultrasound ordered by PCP positive for acute left gastrocnemius DVT. In ER.  CTA PE was ordered in triage, found to acute to subacute bilateral lower lobe and RIGHT upper lobe segmental and sub segmental pulmonary thromboemboli. Overall clot burden is mild. No RIGHT ventricular strain.  Patient reports that he flew back from [place] to [place], 9-hour flight, in [place], he started to notice left lower extremity swelling.  Symptoms  progressively worse and he started to have shortness of breath after he arrived   Patient was treated with Eliquis and he completed 6 months of treatments. Since then, patient reports intermittent left lower extremity swelling. April 2024, patient complained of left lower extremity swelling and ultrasound of left lower extremity done on 01/13/2023 showed no DVT.  He was tested negative for prothrombin gene mutation and factor V Leiden mutation. Patient reports that he wears compression stocking every day. Family history positive for father had a history of blood clot.    MEDICAL HISTORY:  Past Medical History:  Diagnosis Date    Hyperlipidemia    Lyme disease 2004    SURGICAL HISTORY: Past Surgical History:  Procedure Laterality Date   VASECTOMY      SOCIAL HISTORY: Social History   Socioeconomic History   Marital status: Married    Spouse name: Tresa Endo    Number of children: 2   Years of education: 12   Highest education level: Not on file  Occupational History   Not on file  Tobacco Use   Smoking status: Never   Smokeless tobacco: Never  Substance and Sexual Activity   Alcohol use: No   Drug use: No   Sexual activity: Yes    Partners: Female  Other Topics Concern   Not on file  Social History Narrative   Marital Status: Married Heritage manager)    Children:  Daughter (Danielle) Son Reuel Boom)    Pets: None    Living Situation: Lives with wife and children    Occupation:  Emergency planning/management officer - Corporate treasurer)    Education: Engineer, agricultural    Tobacco Use/Exposure:  None    Alcohol Use:  None    Drug Use:  None   Diet:  Regular   Exercise:  Limited    Hobbies:  Psychologist, forensic               Social Determinants of Health   Financial Resource Strain: Low Risk  (06/09/2023)   Overall Financial Resource Strain (CARDIA)    Difficulty of Paying Living Expenses: Not very hard  Food Insecurity: No Food Insecurity (06/09/2023)   Hunger Vital Sign    Worried About Running Out of Food in the Last Year: Never true    Ran Out of Food in the Last Year: Never true  Transportation Needs: No Transportation Needs (06/09/2023)   PRAPARE - Administrator, Civil Service (Medical): No    Lack of Transportation (Non-Medical): No  Physical Activity: Not on file  Stress: No Stress Concern Present (06/09/2023)   [employer] of Occupational Health - Occupational Stress Questionnaire    Feeling of Stress : Only a little  Social Connections: Not on file  Intimate Partner Violence: Not At Risk (06/09/2023)   Humiliation, Afraid, Rape, and Kick questionnaire    Fear of Current or Ex-Partner: No     Emotionally Abused: No    Physically Abused: No    Sexually Abused: No    FAMILY HISTORY: Family History  Problem Relation Age of Onset   Thrombosis Father    Diabetes Father     ALLERGIES:  has No Known Allergies.  MEDICATIONS:  Current Outpatient Medications  Medication Sig Dispense Refill   aspirin EC 81 MG tablet Take 81 mg by mouth daily. Swallow whole.     ELIQUIS 5 MG TABS tablet Take 5 mg by mouth 2 (two) times daily. (Patient not taking: Reported on 06/09/2023)  No current facility-administered medications for this visit.    Review of Systems  Constitutional:  Negative for appetite change, chills, fatigue, fever and unexpected weight change.  HENT:   Negative for hearing loss and voice change.   Eyes:  Negative for eye problems and icterus.  Respiratory:  Negative for chest tightness, cough and shortness of breath.   Cardiovascular:  Positive for leg swelling. Negative for chest pain.  Gastrointestinal:  Negative for abdominal distention and abdominal pain.  Endocrine: Negative for hot flashes.  Genitourinary:  Negative for difficulty urinating, dysuria and frequency.   Musculoskeletal:  Negative for arthralgias.  Skin:  Negative for itching and rash.  Neurological:  Negative for light-headedness and numbness.  Hematological:  Negative for adenopathy. Does not bruise/bleed easily.  Psychiatric/Behavioral:  Negative for confusion.    PHYSICAL EXAMINATION:  Vitals:   06/09/23 1109  BP: 107/78  Pulse: 80  Resp: 18  Temp: (!) 96.1 F (35.6 C)  SpO2: 98%   Filed Weights   06/09/23 1109  Weight: 236 lb 3.2 oz (107.1 kg)    Physical Exam Constitutional:      General: He is not in acute distress. HENT:     Head: Normocephalic and atraumatic.  Eyes:     General: No scleral icterus. Cardiovascular:     Rate and Rhythm: Normal rate and regular rhythm.     Heart sounds: Normal heart sounds.  Pulmonary:     Effort: Pulmonary effort is normal. No  respiratory distress.     Breath sounds: No wheezing.  Abdominal:     General: Bowel sounds are normal. There is no distension.     Palpations: Abdomen is soft.  Musculoskeletal:        General: No deformity. Normal range of motion.     Cervical back: Normal range of motion and neck supple.     Left lower leg: Edema present.     Comments: 1+ edema.  Skin:    General: Skin is warm and dry.     Findings: No erythema or rash.  Neurological:     Mental Status: He is alert and oriented to person, place, and time. Mental status is at baseline.     Cranial Nerves: No cranial nerve deficit.     Coordination: Coordination normal.  Psychiatric:        Mood and Affect: Mood normal.     LABORATORY DATA:  I have reviewed the data as listed    Latest Ref Rng & Units 06/09/2023   11:50 AM 11/07/2021    1:34 PM 06/06/2021   12:18 PM  CBC  WBC 4.0 - 10.5 K/uL 7.1  8.7  6.6   Hemoglobin 13.0 - 17.0 g/dL 16.1  09.6  04.5   Hematocrit 39.0 - 52.0 % 44.6  42.7  45.1   Platelets 150 - 400 K/uL 335  357  335       Latest Ref Rng & Units 11/07/2021    1:34 PM 06/06/2021   12:18 PM 05/02/2021    5:52 PM  CMP  Glucose 70 - 99 mg/dL 409  96  811   BUN 6 - 20 mg/dL 19  17  20    Creatinine 0.61 - 1.24 mg/dL 9.14  7.82  9.56   Sodium 135 - 145 mmol/L 137  139  137   Potassium 3.5 - 5.1 mmol/L 4.0  4.5  3.7   Chloride 98 - 111 mmol/L 106  105  106   CO2 22 -  32 mmol/L 25  24  24    Calcium 8.9 - 10.3 mg/dL 9.6  9.7  9.7   Total Protein 6.5 - 8.1 g/dL 7.4  7.5    Total Bilirubin 0.3 - 1.2 mg/dL 0.7  1.5    Alkaline Phos 38 - 126 U/L 74  77    AST 15 - 41 U/L 17  24    ALT 0 - 44 U/L 21  26        RADIOGRAPHIC STUDIES: I have personally reviewed the radiological images as listed and agreed with the findings in the report. No results found.

## 2023-06-10 LAB — ANTIPHOSPHOLIPID SYNDROME PROF
Anticardiolipin IgG: <9 GPL U/mL (ref 0–14)
Anticardiolipin IgM: <9 [MPL'U]/mL (ref 0–12)
DRVVT: 31.7 s (ref 0.0–47.0)
PTT Lupus Anticoagulant: 34.3 s (ref 0.0–43.5)

## 2023-06-10 LAB — BETA-2-GLYCOPROTEIN I ABS, IGG/M/A
Beta-2 Glyco I IgG: <9 GPI IgG units (ref 0–20)
Beta-2-Glycoprotein I IgA: 10 GPI IgA units (ref 0–25)
Beta-2-Glycoprotein I IgM: 11 GPI IgM units (ref 0–32)

## 2023-06-10 LAB — PROTEIN S, TOTAL AND FREE
Protein S Ag, Free: 131 % (ref 61–136)
Protein S Ag, Total: 92 % (ref 60–150)

## 2023-06-10 LAB — PROTEIN C ACTIVITY: Protein C Activity: 140 % (ref 73–180)

## 2023-06-10 LAB — ANTITHROMBIN III: AntiThromb III Func: 83 % (ref 75–120)

## 2023-06-12 LAB — PNH PROFILE (-HIGH SENSITIVITY)

## 2023-06-16 ENCOUNTER — Ambulatory Visit (HOSPITAL_COMMUNITY): Payer: No Typology Code available for payment source

## 2023-06-19 ENCOUNTER — Ambulatory Visit (HOSPITAL_COMMUNITY): Payer: No Typology Code available for payment source | Attending: Family Medicine

## 2023-06-19 DIAGNOSIS — R0602 Shortness of breath: Secondary | ICD-10-CM | POA: Diagnosis not present

## 2023-06-19 LAB — ECHOCARDIOGRAM COMPLETE
Area-P 1/2: 4.54 cm2
S' Lateral: 3.5 cm

## 2023-07-14 ENCOUNTER — Ambulatory Visit (INDEPENDENT_AMBULATORY_CARE_PROVIDER_SITE_OTHER): Payer: No Typology Code available for payment source | Admitting: Vascular Surgery

## 2023-07-14 ENCOUNTER — Encounter (INDEPENDENT_AMBULATORY_CARE_PROVIDER_SITE_OTHER): Payer: Self-pay | Admitting: Vascular Surgery

## 2023-07-14 VITALS — BP 103/72 | HR 93 | Resp 16 | Wt 239.0 lb

## 2023-07-14 DIAGNOSIS — I87009 Postthrombotic syndrome without complications of unspecified extremity: Secondary | ICD-10-CM | POA: Diagnosis not present

## 2023-07-14 DIAGNOSIS — Z86718 Personal history of other venous thrombosis and embolism: Secondary | ICD-10-CM

## 2023-07-14 DIAGNOSIS — I890 Lymphedema, not elsewhere classified: Secondary | ICD-10-CM | POA: Diagnosis not present

## 2023-07-14 DIAGNOSIS — K219 Gastro-esophageal reflux disease without esophagitis: Secondary | ICD-10-CM

## 2023-07-14 NOTE — Progress Notes (Signed)
MRN : [id number]  Dennis Leach is a 47 y.o. (04-Nov-1975) male who presents with chief complaint of legs hurt and swell.  History of Present Illness:  The patient presents to the office for evaluation of DVT and post phlebitic syndrome.  05/02/2021: Presented to ED with left lower leg edema, chest pain and shortness of breath.  -Doppler US: Acute DVT involving left gastrocnemius veins. Acute superficial vein thrombosis involving left great saphenous vein from the level of the mid thigh to the calf, and  left varicosities or other superficial veins.  -CTA chest revealed acute to subacute bilateral lower lobe and RIGHT upper lobe segmental and sub segmental pulmonary thromboemboli. Overall clot burden is mild. -Started anticoagulation with Eliquis.     The initial symptoms in 2022 were pain and swelling in the lower extremity.  The patient notes the affected leg is painful with dependency and swells.  Symptoms are much better with elevation.  The patient notes minimal edema in the morning which steadily worsens throughout the day.    The patient has not been using compression therapy at this point.  No recent SOB or pleuritic chest pains.  No cough or hemoptysis.  No blood per rectum or blood in any sputum.  No excessive bruising per the patient.   No recent shortening of the patient's walking distance or new symptoms consistent with claudication.  No history of rest pain symptoms. No new ulcers or wounds of the lower extremities have occurred.  The patient denies amaurosis fugax or recent TIA symptoms. There are no recent neurological changes noted. No recent episodes of angina or shortness of breath documented.    No outpatient medications have been marked as taking for the 07/14/23 encounter (Appointment) with Gilda Crease, Latina Craver, MD.    Past Medical History:  Diagnosis Date   Hyperlipidemia    Lyme disease 2004    Past Surgical History:  Procedure Laterality Date    VASECTOMY      Social History Social History   Tobacco Use   Smoking status: Never   Smokeless tobacco: Never  Substance Use Topics   Alcohol use: No   Drug use: No    Family History Family History  Problem Relation Age of Onset   Thrombosis Father    Diabetes Father     No Known Allergies   REVIEW OF SYSTEMS (Negative unless checked)  Constitutional: [] Weight loss  [] Fever  [] Chills Cardiac: [] Chest pain   [] Chest pressure   [] Palpitations   [] Shortness of breath when laying flat   [] Shortness of breath with exertion. Vascular:  [] Pain in legs with walking   [x] Pain in legs at rest  [] History of DVT   [] Phlebitis   [x] Swelling in legs   [] Varicose veins   [] Non-healing ulcers Pulmonary:   [] Uses home oxygen   [] Productive cough   [] Hemoptysis   [] Wheeze  [] COPD   [] Asthma Neurologic:  [] Dizziness   [] Seizures   [] History of stroke   [] History of TIA  [] Aphasia   [] Vissual changes   [] Weakness or numbness in arm   [] Weakness or numbness in leg Musculoskeletal:   [] Joint swelling   [] Joint pain   [] Low back pain Hematologic:  [] Easy bruising  [] Easy bleeding   [] Hypercoagulable state   [] Anemic Gastrointestinal:  [] Diarrhea   [] Vomiting  [x] Gastroesophageal reflux/heartburn   [] Difficulty swallowing. Genitourinary:  [] Chronic kidney disease   [] Difficult urination  [] Frequent urination   [] Blood in urine Skin:  [] Rashes   []   Ulcers  Psychological:  [] History of anxiety   []  History of major depression.  Physical Examination  There were no vitals filed for this visit. There is no height or weight on file to calculate BMI. Gen: WD/WN, NAD Head: [place]/AT, No temporalis wasting.  Ear/Nose/Throat: Hearing grossly intact, nares w/o erythema or drainage, pinna without lesions Eyes: PER, EOMI, sclera nonicteric.  Neck: Supple, no gross masses.  No JVD.  Pulmonary:  Good air movement, no audible wheezing, no use of accessory muscles.  Cardiac: RRR, precordium not  hyperdynamic. Vascular:  scattered varicosities present bilaterally.  Moderate venous stasis changes to the legs bilaterally.  2+ soft pitting edema. CEAP C4sEpAsPr   Vessel Right Left  Radial Palpable Palpable  Gastrointestinal: soft, non-distended. No guarding/no peritoneal signs.  Musculoskeletal: M/S 5/5 throughout.  No deformity.  Neurologic: CN 2-12 intact. Pain and light touch intact in extremities.  Symmetrical.  Speech is fluent. Motor exam as listed above. Psychiatric: Judgment intact, Mood & affect appropriate for pt's clinical situation. Dermatologic: Venous rashes no ulcers noted.  No changes consistent with cellulitis. Lymph : No lichenification or skin changes of chronic lymphedema.  CBC Lab Results  Component Value Date   WBC 7.1 06/09/2023   HGB 15.1 06/09/2023   HCT 44.6 06/09/2023   MCV 89.6 06/09/2023   PLT 335 06/09/2023    BMET    Component Value Date/Time   NA 137 11/07/2021 1334   K 4.0 11/07/2021 1334   CL 106 11/07/2021 1334   CO2 25 11/07/2021 1334   GLUCOSE 101 (H) 11/07/2021 1334   BUN 19 11/07/2021 1334   CREATININE 1.00 11/07/2021 1334   CREATININE 1.08 01/31/2014 0816   CALCIUM 9.6 11/07/2021 1334   GFRNONAA >60 11/07/2021 1334   GFRNONAA 85 01/31/2014 0816   GFRAA >89 01/31/2014 0816   CrCl cannot be calculated (Patient's most recent lab result is older than the maximum 21 days allowed.).  COAG No results found for: "INR", "PROTIME"  Radiology ECHOCARDIOGRAM COMPLETE  Result Date: 06/19/2023    ECHOCARDIOGRAM REPORT   Patient Name:   Dennis Leach   Date of Exam: 06/19/2023 Medical Rec #:  [id number]     Height:       68.0 in Accession #:    [id number]    Weight:       236.2 lb Date of Birth:  03-23-1976      BSA:          2.193 m Patient Age:    47 years      BP:           107/78 mmHg Patient Gender: M             HR:           82 bpm. Exam Location:  [address] Procedure: 2D Echo, Cardiac Doppler and Color Doppler Indications:    R06.02  Shortness of breath  History:        Patient has no prior history of Echocardiogram examinations.  Sonographer:    Daphine Deutscher RDCS Referring Phys: [id number] Shon Hale IMPRESSIONS  1. Left ventricular ejection fraction, by estimation, is 60 to 65%. The left ventricle has normal function. The left ventricle has no regional wall motion abnormalities. Left ventricular diastolic parameters were normal.  2. Right ventricular systolic function is normal. The right ventricular size is normal.  3. The mitral valve is normal in structure. Trivial mitral valve regurgitation. No evidence of mitral stenosis.  4. The aortic  valve is tricuspid. Aortic valve regurgitation is not visualized. No aortic stenosis is present.  5. The inferior vena cava is normal in size with greater than 50% respiratory variability, suggesting right atrial pressure of 3 mmHg. Conclusion(s)/Recommendation(s): Normal biventricular function without evidence of hemodynamically significant valvular heart disease. FINDINGS  Left Ventricle: Left ventricular ejection fraction, by estimation, is 60 to 65%. The left ventricle has normal function. The left ventricle has no regional wall motion abnormalities. The left ventricular internal cavity size was normal in size. There is  no left ventricular hypertrophy. Left ventricular diastolic parameters were normal. Right Ventricle: The right ventricular size is normal. No increase in right ventricular wall thickness. Right ventricular systolic function is normal. Left Atrium: Left atrial size was normal in size. Right Atrium: Right atrial size was normal in size. Pericardium: There is no evidence of pericardial effusion. Mitral Valve: The mitral valve is normal in structure. Trivial mitral valve regurgitation. No evidence of mitral valve stenosis. Tricuspid Valve: The tricuspid valve is normal in structure. Tricuspid valve regurgitation is not demonstrated. No evidence of tricuspid stenosis.  Aortic Valve: The aortic valve is tricuspid. Aortic valve regurgitation is not visualized. No aortic stenosis is present. Pulmonic Valve: The pulmonic valve was normal in structure. Pulmonic valve regurgitation is not visualized. No evidence of pulmonic stenosis. Aorta: The aortic root is normal in size and structure. Venous: The inferior vena cava is normal in size with greater than 50% respiratory variability, suggesting right atrial pressure of 3 mmHg. IAS/Shunts: No atrial level shunt detected by color flow Doppler.  LEFT VENTRICLE PLAX 2D LVIDd:         4.90 cm   Diastology LVIDs:         3.50 cm   LV e' medial:    7.88 cm/s LV PW:         0.50 cm   LV E/e' medial:  9.1 LV IVS:        0.60 cm   LV e' lateral:   12.10 cm/s LVOT diam:     2.00 cm   LV E/e' lateral: 6.0 LV SV:         52 LV SV Index:   24 LVOT Area:     3.14 cm  RIGHT VENTRICLE RV Basal diam:  4.00 cm RV S Leach:     12.37 cm/s TAPSE (M-mode): 2.2 cm LEFT ATRIUM             Index        RIGHT ATRIUM           Index LA diam:        3.90 cm 1.78 cm/m   RA Area:     10.70 cm LA Vol (A2C):   28.3 ml 12.90 ml/m  RA Volume:   23.90 ml  10.90 ml/m LA Vol (A4C):   25.4 ml 11.58 ml/m LA Biplane Vol: 26.9 ml 12.26 ml/m  AORTIC VALVE LVOT Vmax:   87.37 cm/s LVOT Vmean:  57.300 cm/s LVOT VTI:    0.164 m  AORTA Ao Root diam: 3.50 cm Ao Asc diam:  3.30 cm MITRAL VALVE MV Area (PHT): 4.54 cm    SHUNTS MV Decel Time: 167 msec    Systemic VTI:  0.16 m MV E velocity: 72.05 cm/s  Systemic Diam: 2.00 cm MV A velocity: 68.55 cm/s MV E/A ratio:  1.05 Donato Schultz MD Electronically signed by Donato Schultz MD Signature Date/Time: 06/19/2023/5:21:40 PM    Final  Assessment/Plan 1. Lymphedema Recommend:  No surgery or intervention at this point in time.   The Patient is CEAP C4sEpAsPr.  The patient has been wearing compression for more than 12 weeks with no or little benefit.  The patient has been exercising daily for more than 12 weeks. The patient has  been elevating and taking OTC pain medications for more than 12 weeks.  None of these have have eliminated the pain related to the lymphedema or the discomfort regarding excessive swelling and venous congestion.    I have reviewed my discussion with the patient regarding lymphedema and why it  causes symptoms.  Patient will continue wearing graduated compression on a daily basis. The patient should put the compression on first thing in the morning and removing them in the evening. The patient should not sleep in the compression.   In addition, behavioral modification throughout the day will be continued.  This will include frequent elevation (such as in a recliner), use of over the counter pain medications as needed and exercise such as walking.  The systemic causes for chronic edema such as liver, kidney and cardiac etiologies do not appear to have significant changed over the past year.    The patient has chronic , severe lymphedema with hyperpigmentation of the skin and has done MLD, skin care, medication, diet, exercise, elevation and compression for 4 weeks with no improvement,  I am recommending a lymphedema pump.  The patient still has stage 3 lymphedema and therefore, I believe that a lymph pump is needed to improve the control of the patient's lymphedema and improve the quality of life.  Additionally, a lymph pump is warranted because it will reduce the risk of cellulitis and ulceration in the future.  Patient should follow-up in six months   2. Postthrombotic syndrome Recommend:  No surgery or intervention at this point in time.   The Patient is CEAP C4sEpAsPr.  The patient has been wearing compression for more than 12 weeks with no or little benefit.  The patient has been exercising daily for more than 12 weeks. The patient has been elevating and taking OTC pain medications for more than 12 weeks.  None of these have have eliminated the pain related to the lymphedema or the discomfort  regarding excessive swelling and venous congestion.    I have reviewed my discussion with the patient regarding lymphedema and why it  causes symptoms.  Patient will continue wearing graduated compression on a daily basis. The patient should put the compression on first thing in the morning and removing them in the evening. The patient should not sleep in the compression.   In addition, behavioral modification throughout the day will be continued.  This will include frequent elevation (such as in a recliner), use of over the counter pain medications as needed and exercise such as walking.  The systemic causes for chronic edema such as liver, kidney and cardiac etiologies do not appear to have significant changed over the past year.    The patient has chronic , severe lymphedema with hyperpigmentation of the skin and has done MLD, skin care, medication, diet, exercise, elevation and compression for 4 weeks with no improvement,  I am recommending a lymphedema pump.  The patient still has stage 3 lymphedema and therefore, I believe that a lymph pump is needed to improve the control of the patient's lymphedema and improve the quality of life.  Additionally, a lymph pump is warranted because it will reduce the risk of cellulitis  and ulceration in the future.  Patient should follow-up in six months   3. Personal history of venous thrombosis and embolism Recommend:   No surgery or intervention at this point in time.  IVC filter is not indicated at present.  The patient is on anticoagulation   Elevation was stressed, use of a recliner was discussed.  I have reviewed with the patient DVT and post phlebitic changes such as swelling and why it  causes symptoms such as pain.  I recommended to the patient to wear graduated compression stockings, beginning after three full days of anticoagulation.  Graduated compression should be worn on a daily basis. The patient should wear compression beginning first  thing in the morning and removing them in the evening. The patient is instructed specifically not to sleep in the stockings.  In addition, behavioral modification including elevation during the day and avoidance of prolonged dependency will be initiated.    The patient will continue anticoagulation for now as there have not been any problems or complications from anticoagulation therapy at this point.  The patient will follow-up with me with noninvasive studies as ordered.   4. Gastroesophageal reflux disease, unspecified whether esophagitis present Continue PPI as already ordered, this medication has been reviewed and there are no changes at this time.  Avoidence of caffeine and alcohol  Moderate elevation of the head of the bed     Levora Dredge, MD  07/14/2023 9:24 AM

## 2023-07-16 DIAGNOSIS — R7303 Prediabetes: Secondary | ICD-10-CM | POA: Diagnosis not present

## 2023-07-16 DIAGNOSIS — G4730 Sleep apnea, unspecified: Secondary | ICD-10-CM | POA: Diagnosis not present

## 2023-07-16 DIAGNOSIS — R635 Abnormal weight gain: Secondary | ICD-10-CM | POA: Diagnosis not present

## 2023-07-16 DIAGNOSIS — E669 Obesity, unspecified: Secondary | ICD-10-CM | POA: Diagnosis not present

## 2023-07-27 ENCOUNTER — Encounter (INDEPENDENT_AMBULATORY_CARE_PROVIDER_SITE_OTHER): Payer: Self-pay | Admitting: Vascular Surgery

## 2023-07-27 DIAGNOSIS — I890 Lymphedema, not elsewhere classified: Secondary | ICD-10-CM | POA: Insufficient documentation

## 2023-09-03 DIAGNOSIS — G4730 Sleep apnea, unspecified: Secondary | ICD-10-CM | POA: Diagnosis not present

## 2023-09-03 DIAGNOSIS — R7303 Prediabetes: Secondary | ICD-10-CM | POA: Diagnosis not present

## 2023-09-03 DIAGNOSIS — E66811 Obesity, class 1: Secondary | ICD-10-CM | POA: Diagnosis not present

## 2023-09-03 DIAGNOSIS — R635 Abnormal weight gain: Secondary | ICD-10-CM | POA: Diagnosis not present

## 2023-09-10 DIAGNOSIS — I890 Lymphedema, not elsewhere classified: Secondary | ICD-10-CM | POA: Diagnosis not present

## 2023-10-11 DIAGNOSIS — I890 Lymphedema, not elsewhere classified: Secondary | ICD-10-CM | POA: Diagnosis not present

## 2023-10-29 DIAGNOSIS — R635 Abnormal weight gain: Secondary | ICD-10-CM | POA: Diagnosis not present

## 2023-10-29 DIAGNOSIS — G4730 Sleep apnea, unspecified: Secondary | ICD-10-CM | POA: Diagnosis not present

## 2023-10-29 DIAGNOSIS — E66811 Obesity, class 1: Secondary | ICD-10-CM | POA: Diagnosis not present

## 2023-10-29 DIAGNOSIS — R7303 Prediabetes: Secondary | ICD-10-CM | POA: Diagnosis not present

## 2023-11-11 DIAGNOSIS — I890 Lymphedema, not elsewhere classified: Secondary | ICD-10-CM | POA: Diagnosis not present

## 2023-11-24 DIAGNOSIS — G4730 Sleep apnea, unspecified: Secondary | ICD-10-CM | POA: Diagnosis not present

## 2023-11-24 DIAGNOSIS — R635 Abnormal weight gain: Secondary | ICD-10-CM | POA: Diagnosis not present

## 2023-11-24 DIAGNOSIS — E66811 Obesity, class 1: Secondary | ICD-10-CM | POA: Diagnosis not present

## 2023-11-24 DIAGNOSIS — R7303 Prediabetes: Secondary | ICD-10-CM | POA: Diagnosis not present

## 2023-12-09 DIAGNOSIS — I890 Lymphedema, not elsewhere classified: Secondary | ICD-10-CM | POA: Diagnosis not present

## 2024-01-09 DIAGNOSIS — I890 Lymphedema, not elsewhere classified: Secondary | ICD-10-CM | POA: Diagnosis not present

## 2024-01-12 ENCOUNTER — Ambulatory Visit (INDEPENDENT_AMBULATORY_CARE_PROVIDER_SITE_OTHER): Payer: No Typology Code available for payment source | Admitting: Vascular Surgery

## 2024-01-12 ENCOUNTER — Encounter (INDEPENDENT_AMBULATORY_CARE_PROVIDER_SITE_OTHER): Payer: Self-pay | Admitting: Vascular Surgery

## 2024-01-12 VITALS — BP 101/72 | HR 88 | Resp 16 | Ht 67.0 in | Wt 234.8 lb

## 2024-01-12 DIAGNOSIS — I890 Lymphedema, not elsewhere classified: Secondary | ICD-10-CM | POA: Diagnosis not present

## 2024-01-12 DIAGNOSIS — Z86718 Personal history of other venous thrombosis and embolism: Secondary | ICD-10-CM

## 2024-01-12 DIAGNOSIS — K219 Gastro-esophageal reflux disease without esophagitis: Secondary | ICD-10-CM | POA: Diagnosis not present

## 2024-01-12 NOTE — Progress Notes (Signed)
 MRN : [id number]  Dennis Leach is a 48 y.o. (08-10-1976) male who presents with chief complaint of legs swell.  History of Present Illness:    The patient returns to the office for followup evaluation regarding leg swelling.  The swelling has persisted but with the lymph pump the patient states the swelling is much better controlled. The pain associated with swelling is essentially eliminated. There have not been any interval development of a ulcerations or wounds.  No episodes of cellulitis or infection over the past 12 months  The patient denies problems with the pump, noting it is working well and the leggings are in good condition.  Since the previous visit the patient has been wearing graduated compression stockings and using the lymph pump on a routine basis and  has noted significant improvement in the lymphedema.   Patient stated the lymph pump has been helpful in treating the lymphedema.    No outpatient medications have been marked as taking for the 01/12/24 encounter (Office Visit) with Prescilla Brod, Ninette Basque, MD.    Past Medical History:  Diagnosis Date   Hyperlipidemia    Lyme disease 2004    Past Surgical History:  Procedure Laterality Date   VASECTOMY      Social History Social History   Tobacco Use   Smoking status: Never   Smokeless tobacco: Never  Substance Use Topics   Alcohol use: No   Drug use: No    Family History Family History  Problem Relation Age of Onset   Thrombosis Father    Diabetes Father     No Known Allergies   REVIEW OF SYSTEMS (Negative unless checked)  Constitutional: [] Weight loss  [] Fever  [] Chills Cardiac: [] Chest pain   [] Chest pressure   [] Palpitations   [] Shortness of breath when laying flat   [] Shortness of breath with exertion. Vascular:  [] Pain in legs with walking   [x] Pain in legs with standing  [] History of DVT   [] Phlebitis   [x] Swelling in legs   [] Varicose veins   [] Non-healing  ulcers Pulmonary:   [] Uses home oxygen   [] Productive cough   [] Hemoptysis   [] Wheeze  [] COPD   [] Asthma Neurologic:  [] Dizziness   [] Seizures   [] History of stroke   [] History of TIA  [] Aphasia   [] Vissual changes   [] Weakness or numbness in arm   [] Weakness or numbness in leg Musculoskeletal:   [] Joint swelling   [] Joint pain   [] Low back pain Hematologic:  [] Easy bruising  [] Easy bleeding   [] Hypercoagulable state   [] Anemic Gastrointestinal:  [] Diarrhea   [] Vomiting  [] Gastroesophageal reflux/heartburn   [] Difficulty swallowing. Genitourinary:  [] Chronic kidney disease   [] Difficult urination  [] Frequent urination   [] Blood in urine Skin:  [] Rashes   [] Ulcers  Psychological:  [] History of anxiety   []  History of major depression.  Physical Examination  Vitals:   01/12/24 1304  BP: 101/72  Pulse: 88  Resp: 16  Weight: 234 lb 12.8 oz (106.5 kg)  Height: 5\' 7"  (1.702 m)   Body mass index is 36.77 kg/m. Gen: WD/WN, NAD Head: [place]/AT, No temporalis wasting.  Ear/Nose/Throat: Hearing grossly intact, nares w/o erythema or drainage, pinna without lesions Eyes: PER, EOMI, sclera nonicteric.  Neck: Supple, no gross masses.  No JVD.  Pulmonary:  Good air movement, no audible wheezing, no use  of accessory muscles.  Cardiac: RRR, precordium not hyperdynamic. Vascular:  scattered varicosities present bilaterally.  Mild venous stasis changes to the legs bilaterally.  1-2+ soft pitting edema, CEAP C4sEpAsPr  Vessel Right Left  Radial Palpable Palpable  Gastrointestinal: soft, non-distended. No guarding/no peritoneal signs.  Musculoskeletal: M/S 5/5 throughout.  No deformity.  Neurologic: CN 2-12 intact. Pain and light touch intact in extremities.  Symmetrical.  Speech is fluent. Motor exam as listed above. Psychiatric: Judgment intact, Mood & affect appropriate for pt's clinical situation. Dermatologic: Venous rashes no ulcers noted.  No changes consistent with cellulitis. Lymph : No  lichenification or skin changes of chronic lymphedema.  CBC Lab Results  Component Value Date   WBC 7.1 06/09/2023   HGB 15.1 06/09/2023   HCT 44.6 06/09/2023   MCV 89.6 06/09/2023   PLT 335 06/09/2023    BMET    Component Value Date/Time   NA 137 11/07/2021 1334   K 4.0 11/07/2021 1334   CL 106 11/07/2021 1334   CO2 25 11/07/2021 1334   GLUCOSE 101 (H) 11/07/2021 1334   BUN 19 11/07/2021 1334   CREATININE 1.00 11/07/2021 1334   CREATININE 1.08 01/31/2014 0816   CALCIUM 9.6 11/07/2021 1334   GFRNONAA >60 11/07/2021 1334   GFRNONAA 85 01/31/2014 0816   GFRAA >89 01/31/2014 0816   CrCl cannot be calculated (Patient's most recent lab result is older than the maximum 21 days allowed.).  COAG No results found for: "INR", "PROTIME"  Radiology No results found.   Assessment/Plan 1. Lymphedema (Primary) Recommend:  No surgery or intervention at this point in time.    I have reviewed my discussion with the patient regarding lymphedema and why it  causes symptoms.  Patient will continue wearing graduated compression on a daily basis. The patient should put the compression on first thing in the morning and removing them in the evening. The patient should not sleep in the compression.   In addition, behavioral modification throughout the day will be continued.  This will include frequent elevation (such as in a recliner), use of over the counter pain medications as needed and exercise such as walking.  The systemic causes for chronic edema such as liver, kidney and cardiac etiologies does not appear to have significant changed over the past year.    The patient will continue aggressive use of the  lymph pump.  This will continue to improve the edema control and prevent sequela such as ulcers and infections.   The patient will follow-up with me on an annual basis.   2. Gastroesophageal reflux disease, unspecified whether esophagitis present Continue PPI as already ordered,  this medication has been reviewed and there are no changes at this time.  Avoidence of caffeine and alcohol  Moderate elevation of the head of the bed   3. Personal history of venous thrombosis and embolism Patient has completed anticoagulation and is no longer on his Eliquis  he is doing well.    Devon Fogo, MD  01/12/2024 1:12 PM

## 2024-01-17 ENCOUNTER — Encounter (INDEPENDENT_AMBULATORY_CARE_PROVIDER_SITE_OTHER): Payer: Self-pay | Admitting: Vascular Surgery

## 2024-02-08 DIAGNOSIS — I890 Lymphedema, not elsewhere classified: Secondary | ICD-10-CM | POA: Diagnosis not present

## 2024-02-10 ENCOUNTER — Encounter (INDEPENDENT_AMBULATORY_CARE_PROVIDER_SITE_OTHER): Payer: Self-pay

## 2024-03-10 DIAGNOSIS — I890 Lymphedema, not elsewhere classified: Secondary | ICD-10-CM | POA: Diagnosis not present

## 2024-04-09 DIAGNOSIS — I890 Lymphedema, not elsewhere classified: Secondary | ICD-10-CM | POA: Diagnosis not present

## 2024-05-10 DIAGNOSIS — I890 Lymphedema, not elsewhere classified: Secondary | ICD-10-CM | POA: Diagnosis not present

## 2024-06-10 DIAGNOSIS — I890 Lymphedema, not elsewhere classified: Secondary | ICD-10-CM | POA: Diagnosis not present

## 2024-09-14 DIAGNOSIS — J22 Unspecified acute lower respiratory infection: Secondary | ICD-10-CM | POA: Diagnosis not present

## 2024-09-14 DIAGNOSIS — Z2089 Contact with and (suspected) exposure to other communicable diseases: Secondary | ICD-10-CM | POA: Diagnosis not present

## 2024-09-14 DIAGNOSIS — R051 Acute cough: Secondary | ICD-10-CM | POA: Diagnosis not present

## 2024-10-28 NOTE — Progress Notes (Signed)
 Gurdeep Keesey                                          MRN: [id number]   10/28/2024   The [facility] Quality Team Specialist reviewed this patient medical record for the purposes of chart review for care gap closure. The following were reviewed: chart review for care gap closure-colorectal cancer screening.    [facility] Quality Team

## 2025-01-10 ENCOUNTER — Ambulatory Visit (INDEPENDENT_AMBULATORY_CARE_PROVIDER_SITE_OTHER): Admitting: Vascular Surgery
